# Patient Record
Sex: Female | Born: 1990 | Race: White | Hispanic: No | Marital: Single | Smoking: Never smoker
Health system: Southern US, Community
[De-identification: ages and names within clinical notes are randomized; demographics above are authoritative.]

## PROBLEM LIST (undated history)

## (undated) HISTORY — PX: WISDOM TOOTH EXTRACTION: SHX21

---

## 2010-12-28 ENCOUNTER — Observation Stay: Payer: Self-pay | Admitting: Obstetrics & Gynecology

## 2011-01-01 ENCOUNTER — Inpatient Hospital Stay: Payer: Self-pay

## 2013-01-04 IMAGING — US US OB FOLLOW-UP
1 series · 9 of 9 positions shown · non-contrast
Comparison: none

REASON FOR EXAM: AFI  h/o oligohydramnios
COMMENTS:

PROCEDURE:     US  - US OB FOLLOW UP  - December 29, 2010  [DATE]
RESULT:     Limited pelvic sonogram is performed to evaluate amniotic fluid.
Single intrauterine fetus is noted with a heart rate of 122 beats per minute
in a cephalic presentation. The amniotic fluid index is 5.3 cm.

[Series 1: us ob follow-up · 0.37mm/px · 9 of 9 slices shown]
[im 1/9]
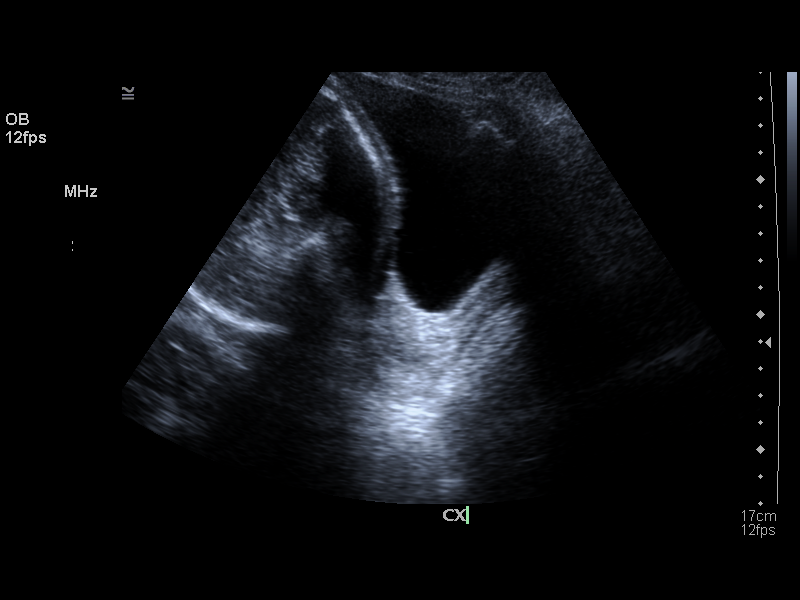
[im 2/9]
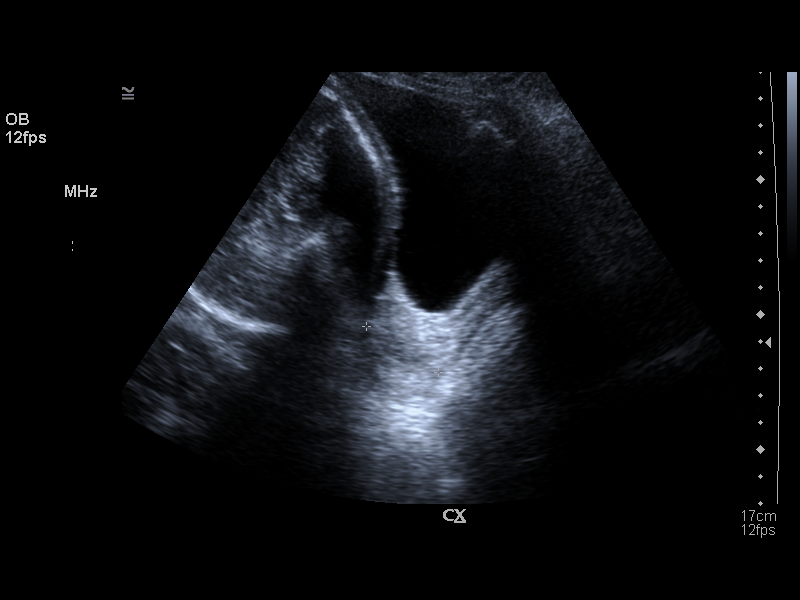
[im 3/9]
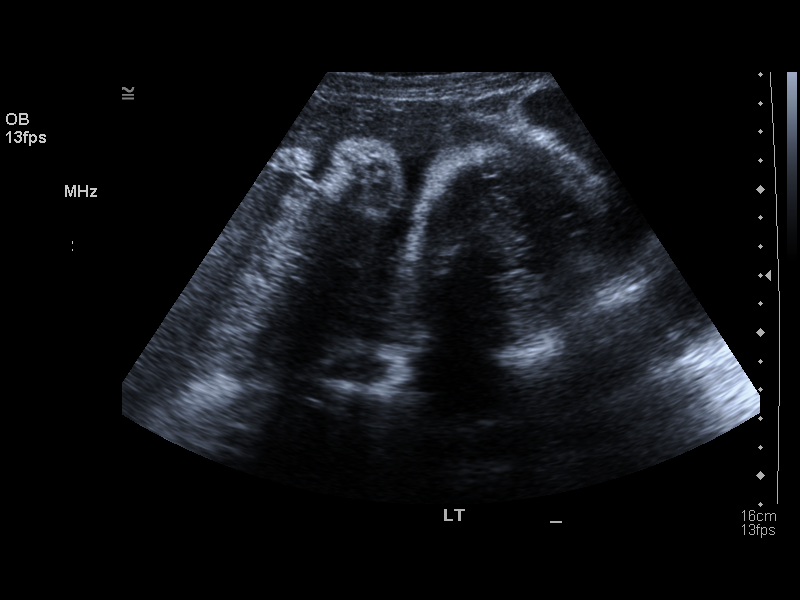
[im 4/9]
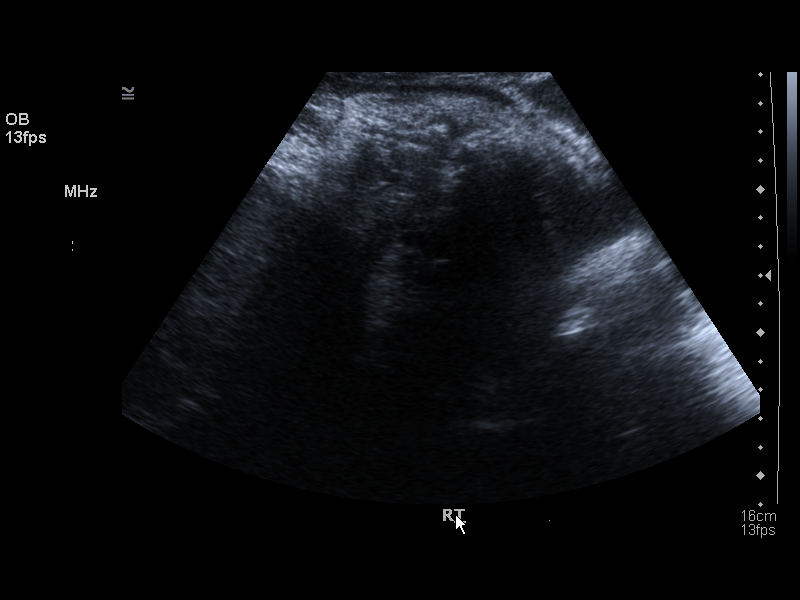
[im 5/9]
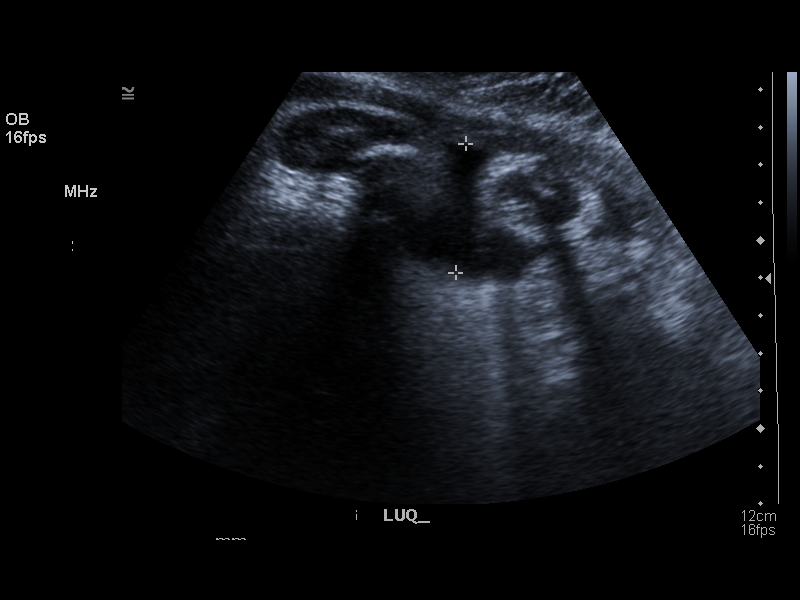
[im 6/9]
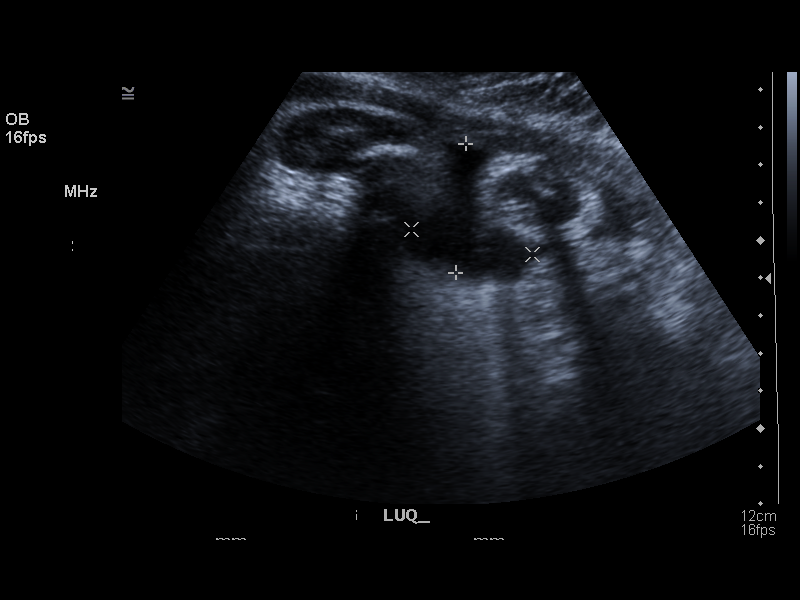
[im 7/9]
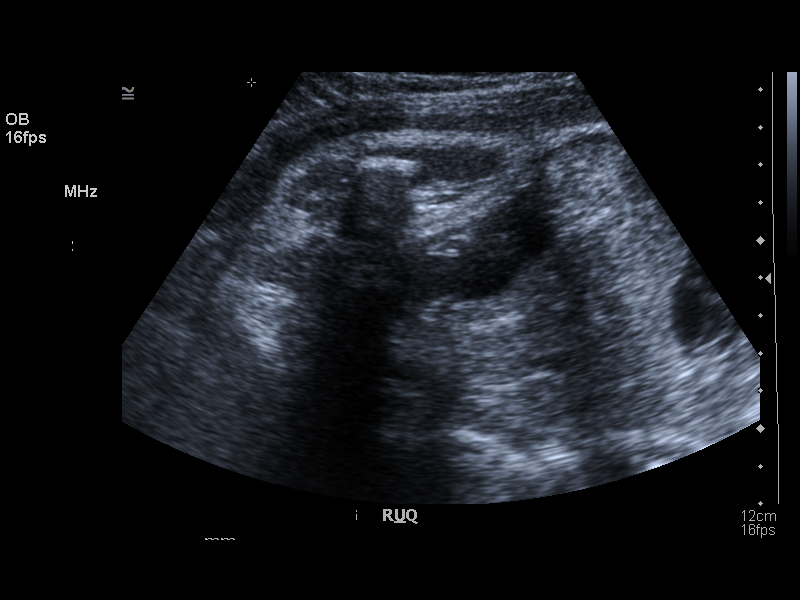
[im 8/9]
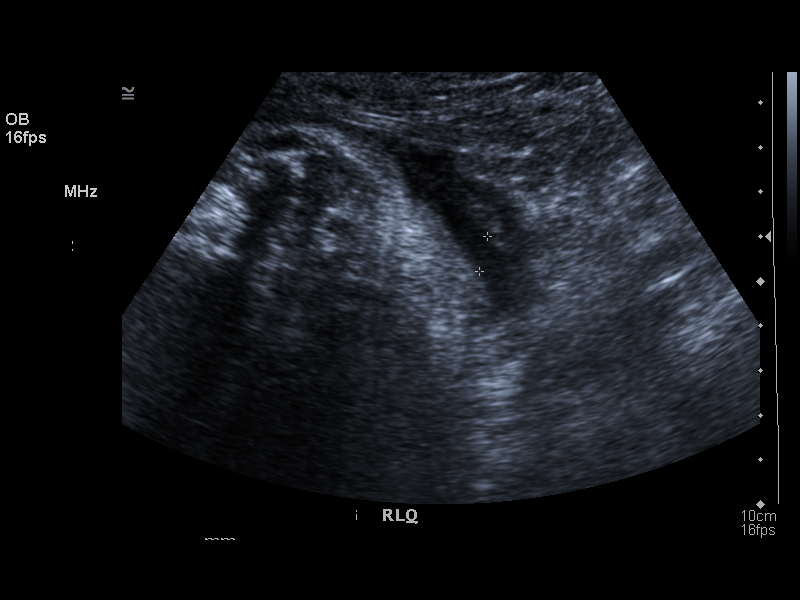
[im 9/9]
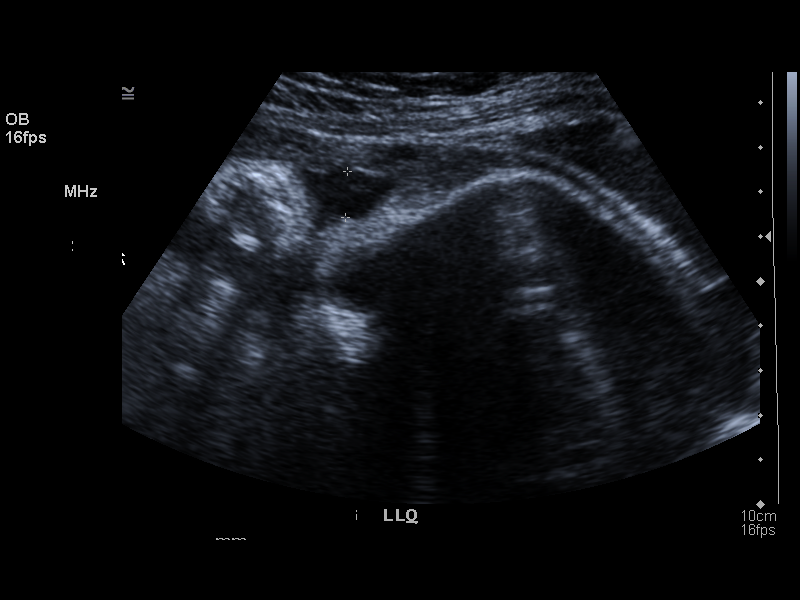

[9 of 9 positions shown; findings below may reference images not displayed]

IMPRESSION: 5.3 cm AFI. Given the lack of knowledge of the gestational
age of the intrauterine fetus the percentile of normal is uncertain.

## 2015-01-27 ENCOUNTER — Encounter: Payer: Self-pay | Admitting: Family Medicine

## 2015-01-27 ENCOUNTER — Ambulatory Visit (INDEPENDENT_AMBULATORY_CARE_PROVIDER_SITE_OTHER): Payer: Commercial Indemnity | Admitting: Family Medicine

## 2015-01-27 VITALS — BP 108/68 | HR 76 | Ht 62.0 in | Wt 144.0 lb

## 2015-01-27 DIAGNOSIS — Z1151 Encounter for screening for human papillomavirus (HPV): Secondary | ICD-10-CM | POA: Diagnosis not present

## 2015-01-27 DIAGNOSIS — Z124 Encounter for screening for malignant neoplasm of cervix: Secondary | ICD-10-CM

## 2015-01-27 DIAGNOSIS — Z113 Encounter for screening for infections with a predominantly sexual mode of transmission: Secondary | ICD-10-CM | POA: Diagnosis not present

## 2015-01-27 DIAGNOSIS — Z3490 Encounter for supervision of normal pregnancy, unspecified, unspecified trimester: Secondary | ICD-10-CM | POA: Insufficient documentation

## 2015-01-27 DIAGNOSIS — Z3491 Encounter for supervision of normal pregnancy, unspecified, first trimester: Secondary | ICD-10-CM

## 2015-01-27 DIAGNOSIS — Z3481 Encounter for supervision of other normal pregnancy, first trimester: Secondary | ICD-10-CM

## 2015-01-27 NOTE — Progress Notes (Signed)
   Subjective:    Marisa Daugherty is a G2P1001 10063w4d being seen today for her first obstetrical visit.  Her obstetrical history is not significant.  Pregnancy history fully reviewed.  Patient reports no complaints.  Filed Vitals:   01/27/15 1413 01/27/15 1414  BP: 108/68   Pulse: 76   Height:  5\' 2"  (1.575 m)  Weight: 144 lb (65.318 kg)     HISTORY: OB History  Gravida Para Term Preterm AB SAB TAB Ectopic Multiple Living  2 1 1       1     # Outcome Date GA Lbr Len/2nd Weight Sex Delivery Anes PTL Lv  2 Current           1 Term 01/01/11 5754w0d  4 lb 10 oz (2.098 kg) F Vag-Spont   Y     History reviewed. No pertinent past medical history. Past Surgical History  Procedure Laterality Date  . Wisdom tooth extraction     Family History  Problem Relation Age of Onset  . Parkinsonism Maternal Grandmother      Exam    Uterus:    8 wk size  Pelvic Exam:    Perineum: Normal Perineum   Vulva: Bartholin's, Urethra, Skene's normal   Vagina:  normal mucosa   Cervix: multiparous appearance, no cervical motion tenderness and no lesions   Adnexa: normal adnexa   Bony Pelvis: average  System: Breast:  normal appearance, no masses or tenderness   Skin: normal coloration and turgor, no rashes    Neurologic: oriented   Extremities: normal strength, tone, and muscle mass   HEENT extra ocular movement intact and sclera clear, anicteric   Mouth/Teeth mucous membranes moist, pharynx normal without lesions   Neck supple   Cardiovascular: regular rate and rhythm, no murmurs or gallops   Respiratory:  appears well, vitals normal, no respiratory distress, acyanotic, normal RR, ear and throat exam is normal, neck free of mass or lymphadenopathy, chest clear, no wheezing, crepitations, rhonchi, normal symmetric air entry   Abdomen: soft, non-tender; bowel sounds normal; no masses,  no organomegaly      Assessment:    Pregnancy: G2P1001 Patient Active Problem List   Diagnosis Date Noted   . Supervision of normal pregnancy 01/27/2015        Plan:     Initial labs drawn. Prenatal vitamins. Problem list reviewed and updated. Genetic Screening discussed First Screen: ordered.  Ultrasound discussed; fetal survey: discussed.  Follow up in 4 weeks.    PRATT,TANYA S 01/27/2015

## 2015-01-27 NOTE — Progress Notes (Signed)
Limited OB U/s reveals SIUP CRL 7 wk 4 day + flicker--LMP is accurate but cycle varies from 28-32 days

## 2015-01-27 NOTE — Patient Instructions (Signed)
First Trimester of Pregnancy The first trimester of pregnancy is from week 1 until the end of week 12 (months 1 through 3). A week after a sperm fertilizes an egg, the egg will implant on the wall of the uterus. This embryo will begin to develop into a baby. Genes from you and your partner are forming the baby. The female genes determine whether the baby is a boy or a girl. At 6-8 weeks, the eyes and face are formed, and the heartbeat can be seen on ultrasound. At the end of 12 weeks, all the baby's organs are formed.  Now that you are pregnant, you will want to do everything you can to have a healthy baby. Two of the most important things are to get good prenatal care and to follow your health care provider's instructions. Prenatal care is all the medical care you receive before the baby's birth. This care will help prevent, find, and treat any problems during the pregnancy and childbirth. BODY CHANGES Your body goes through many changes during pregnancy. The changes vary from woman to woman.   You may gain or lose a couple of pounds at first.  You may feel sick to your stomach (nauseous) and throw up (vomit). If the vomiting is uncontrollable, call your health care provider.  You may tire easily.  You may develop headaches that can be relieved by medicines approved by your health care provider.  You may urinate more often. Painful urination may mean you have a bladder infection.  You may develop heartburn as a result of your pregnancy.  You may develop constipation because certain hormones are causing the muscles that push waste through your intestines to slow down.  You may develop hemorrhoids or swollen, bulging veins (varicose veins).  Your breasts may begin to grow larger and become tender. Your nipples may stick out more, and the tissue that surrounds them (areola) may become darker.  Your gums may bleed and may be sensitive to brushing and flossing.  Dark spots or blotches  (chloasma, mask of pregnancy) may develop on your face. This will likely fade after the baby is born.  Your menstrual periods will stop.  You may have a loss of appetite.  You may develop cravings for certain kinds of food.  You may have changes in your emotions from day to day, such as being excited to be pregnant or being concerned that something may go wrong with the pregnancy and baby.  You may have more vivid and strange dreams.  You may have changes in your hair. These can include thickening of your hair, rapid growth, and changes in texture. Some women also have hair loss during or after pregnancy, or hair that feels dry or thin. Your hair will most likely return to normal after your baby is born. WHAT TO EXPECT AT YOUR PRENATAL VISITS During a routine prenatal visit:  You will be weighed to make sure you and the baby are growing normally.  Your blood pressure will be taken.  Your abdomen will be measured to track your baby's growth.  The fetal heartbeat will be listened to starting around week 10 or 12 of your pregnancy.  Test results from any previous visits will be discussed. Your health care provider may ask you:  How you are feeling.  If you are feeling the baby move.  If you have had any abnormal symptoms, such as leaking fluid, bleeding, severe headaches, or abdominal cramping.  If you have any questions. Other tests   that may be performed during your first trimester include:  Blood tests to find your blood type and to check for the presence of any previous infections. They will also be used to check for low iron levels (anemia) and Rh antibodies. Later in the pregnancy, blood tests for diabetes will be done along with other tests if problems develop.  Urine tests to check for infections, diabetes, or protein in the urine.  An ultrasound to confirm the proper growth and development of the baby.  An amniocentesis to check for possible genetic problems.  Fetal  screens for spina bifida and Down syndrome.  You may need other tests to make sure you and the baby are doing well. HOME CARE INSTRUCTIONS  Medicines  Follow your health care provider's instructions regarding medicine use. Specific medicines may be either safe or unsafe to take during pregnancy.  Take your prenatal vitamins as directed.  If you develop constipation, try taking a stool softener if your health care provider approves. Diet  Eat regular, well-balanced meals. Choose a variety of foods, such as meat or vegetable-based protein, fish, milk and low-fat dairy products, vegetables, fruits, and whole grain breads and cereals. Your health care provider will help you determine the amount of weight gain that is right for you.  Avoid raw meat and uncooked cheese. These carry germs that can cause birth defects in the baby.  Eating four or five small meals rather than three large meals a day may help relieve nausea and vomiting. If you start to feel nauseous, eating a few soda crackers can be helpful. Drinking liquids between meals instead of during meals also seems to help nausea and vomiting.  If you develop constipation, eat more high-fiber foods, such as fresh vegetables or fruit and whole grains. Drink enough fluids to keep your urine clear or pale yellow. Activity and Exercise  Exercise only as directed by your health care provider. Exercising will help you:  Control your weight.  Stay in shape.  Be prepared for labor and delivery.  Experiencing pain or cramping in the lower abdomen or low back is a good sign that you should stop exercising. Check with your health care provider before continuing normal exercises.  Try to avoid standing for long periods of time. Move your legs often if you must stand in one place for a long time.  Avoid heavy lifting.  Wear low-heeled shoes, and practice good posture.  You may continue to have sex unless your health care provider directs you  otherwise. Relief of Pain or Discomfort  Wear a good support bra for breast tenderness.   Take warm sitz baths to soothe any pain or discomfort caused by hemorrhoids. Use hemorrhoid cream if your health care provider approves.   Rest with your legs elevated if you have leg cramps or low back pain.  If you develop varicose veins in your legs, wear support hose. Elevate your feet for 15 minutes, 3-4 times a day. Limit salt in your diet. Prenatal Care  Schedule your prenatal visits by the twelfth week of pregnancy. They are usually scheduled monthly at first, then more often in the last 2 months before delivery.  Write down your questions. Take them to your prenatal visits.  Keep all your prenatal visits as directed by your health care provider. Safety  Wear your seat belt at all times when driving.  Make a list of emergency phone numbers, including numbers for family, friends, the hospital, and police and fire departments. General Tips    Ask your health care provider for a referral to a local prenatal education class. Begin classes no later than at the beginning of month 6 of your pregnancy.  Ask for help if you have counseling or nutritional needs during pregnancy. Your health care provider can offer advice or refer you to specialists for help with various needs.  Do not use hot tubs, steam rooms, or saunas.  Do not douche or use tampons or scented sanitary pads.  Do not cross your legs for long periods of time.  Avoid cat litter boxes and soil used by cats. These carry germs that can cause birth defects in the baby and possibly loss of the fetus by miscarriage or stillbirth.  Avoid all smoking, herbs, alcohol, and medicines not prescribed by your health care provider. Chemicals in these affect the formation and growth of the baby.  Schedule a dentist appointment. At home, brush your teeth with a soft toothbrush and be gentle when you floss. SEEK MEDICAL CARE IF:   You have  dizziness.  You have mild pelvic cramps, pelvic pressure, or nagging pain in the abdominal area.  You have persistent nausea, vomiting, or diarrhea.  You have a bad smelling vaginal discharge.  You have pain with urination.  You notice increased swelling in your face, hands, legs, or ankles. SEEK IMMEDIATE MEDICAL CARE IF:   You have a fever.  You are leaking fluid from your vagina.  You have spotting or bleeding from your vagina.  You have severe abdominal cramping or pain.  You have rapid weight gain or loss.  You vomit blood or material that looks like coffee grounds.  You are exposed to German measles and have never had them.  You are exposed to fifth disease or chickenpox.  You develop a severe headache.  You have shortness of breath.  You have any kind of trauma, such as from a fall or a car accident. Document Released: 07/12/2001 Document Revised: 12/02/2013 Document Reviewed: 05/28/2013 [employer] Patient Information 2015 [employer]. This information is not intended to replace advice given to you by your health care provider. Make sure you discuss any questions you have with your health care provider.  Breastfeeding Deciding to breastfeed is one of the best choices you can make for you and your baby. A change in hormones during pregnancy causes your breast tissue to grow and increases the number and size of your milk ducts. These hormones also allow proteins, sugars, and fats from your blood supply to make breast milk in your milk-producing glands. Hormones prevent breast milk from being released before your baby is born as well as prompt milk flow after birth. Once breastfeeding has begun, thoughts of your baby, as well as his or her sucking or crying, can stimulate the release of milk from your milk-producing glands.  BENEFITS OF BREASTFEEDING For Your Baby  Your first milk (colostrum) helps your baby's digestive system function better.   There are antibodies  in your milk that help your baby fight off infections.   Your baby has a lower incidence of asthma, allergies, and sudden infant death syndrome.   The nutrients in breast milk are better for your baby than infant formulas and are designed uniquely for your baby's needs.   Breast milk improves your baby's brain development.   Your baby is less likely to develop other conditions, such as childhood obesity, asthma, or type 2 diabetes mellitus.  For You   Breastfeeding helps to create a very special bond between   you and your baby.   Breastfeeding is convenient. Breast milk is always available at the correct temperature and costs nothing.   Breastfeeding helps to burn calories and helps you lose the weight gained during pregnancy.   Breastfeeding makes your uterus contract to its prepregnancy size faster and slows bleeding (lochia) after you give birth.   Breastfeeding helps to lower your risk of developing type 2 diabetes mellitus, osteoporosis, and breast or ovarian cancer later in life. SIGNS THAT YOUR BABY IS HUNGRY Early Signs of Hunger  Increased alertness or activity.  Stretching.  Movement of the head from side to side.  Movement of the head and opening of the mouth when the corner of the mouth or cheek is stroked (rooting).  Increased sucking sounds, smacking lips, cooing, sighing, or squeaking.  Hand-to-mouth movements.  Increased sucking of fingers or hands. Late Signs of Hunger  Fussing.  Intermittent crying. Extreme Signs of Hunger Signs of extreme hunger will require calming and consoling before your baby will be able to breastfeed successfully. Do not wait for the following signs of extreme hunger to occur before you initiate breastfeeding:   Restlessness.  A loud, strong cry.   Screaming. BREASTFEEDING BASICS Breastfeeding Initiation  Find a comfortable place to sit or lie down, with your neck and back well supported.  Place a pillow or  rolled up blanket under your baby to bring him or her to the level of your breast (if you are seated). Nursing pillows are specially designed to help support your arms and your baby while you breastfeed.  Make sure that your baby's abdomen is facing your abdomen.   Gently massage your breast. With your fingertips, massage from your chest wall toward your nipple in a circular motion. This encourages milk flow. You may need to continue this action during the feeding if your milk flows slowly.  Support your breast with 4 fingers underneath and your thumb above your nipple. Make sure your fingers are well away from your nipple and your baby's mouth.   Stroke your baby's lips gently with your finger or nipple.   When your baby's mouth is open wide enough, quickly bring your baby to your breast, placing your entire nipple and as much of the colored area around your nipple (areola) as possible into your baby's mouth.   More areola should be visible above your baby's upper lip than below the lower lip.   Your baby's tongue should be between his or her lower gum and your breast.   Ensure that your baby's mouth is correctly positioned around your nipple (latched). Your baby's lips should create a seal on your breast and be turned out (everted).  It is common for your baby to suck about 2-3 minutes in order to start the flow of breast milk. Latching Teaching your baby how to latch on to your breast properly is very important. An improper latch can cause nipple pain and decreased milk supply for you and poor weight gain in your baby. Also, if your baby is not latched onto your nipple properly, he or she may swallow some air during feeding. This can make your baby fussy. Burping your baby when you switch breasts during the feeding can help to get rid of the air. However, teaching your baby to latch on properly is still the best way to prevent fussiness from swallowing air while breastfeeding. Signs  that your baby has successfully latched on to your nipple:    Silent tugging or silent   sucking, without causing you pain.   Swallowing heard between every 3-4 sucks.    Muscle movement above and in front of his or her ears while sucking.  Signs that your baby has not successfully latched on to nipple:   Sucking sounds or smacking sounds from your baby while breastfeeding.  Nipple pain. If you think your baby has not latched on correctly, slip your finger into the corner of your baby's mouth to break the suction and place it between your baby's gums. Attempt breastfeeding initiation again. Signs of Successful Breastfeeding Signs from your baby:   A gradual decrease in the number of sucks or complete cessation of sucking.   Falling asleep.   Relaxation of his or her body.   Retention of a small amount of milk in his or her mouth.   Letting go of your breast by himself or herself. Signs from you:  Breasts that have increased in firmness, weight, and size 1-3 hours after feeding.   Breasts that are softer immediately after breastfeeding.  Increased milk volume, as well as a change in milk consistency and color by the fifth day of breastfeeding.   Nipples that are not sore, cracked, or bleeding. Signs That Your Baby is Getting Enough Milk  Wetting at least 3 diapers in a 24-hour period. The urine should be clear and pale yellow by age 5 days.  At least 3 stools in a 24-hour period by age 5 days. The stool should be soft and yellow.  At least 3 stools in a 24-hour period by age 7 days. The stool should be seedy and yellow.  No loss of weight greater than 10% of birth weight during the first 3 days of age.  Average weight gain of 4-7 ounces (113-198 g) per week after age 4 days.  Consistent daily weight gain by age 5 days, without weight loss after the age of 2 weeks. After a feeding, your baby may spit up a small amount. This is common. BREASTFEEDING FREQUENCY AND  DURATION Frequent feeding will help you make more milk and can prevent sore nipples and breast engorgement. Breastfeed when you feel the need to reduce the fullness of your breasts or when your baby shows signs of hunger. This is called "breastfeeding on demand." Avoid introducing a pacifier to your baby while you are working to establish breastfeeding (the first 4-6 weeks after your baby is born). After this time you may choose to use a pacifier. Research has shown that pacifier use during the first year of a baby's life decreases the risk of sudden infant death syndrome (SIDS). Allow your baby to feed on each breast as long as he or she wants. Breastfeed until your baby is finished feeding. When your baby unlatches or falls asleep while feeding from the first breast, offer the second breast. Because newborns are often sleepy in the first few weeks of life, you may need to awaken your baby to get him or her to feed. Breastfeeding times will vary from baby to baby. However, the following rules can serve as a guide to help you ensure that your baby is properly fed:  Newborns (babies 4 weeks of age or younger) may breastfeed every 1-3 hours.  Newborns should not go longer than 3 hours during the day or 5 hours during the night without breastfeeding.  You should breastfeed your baby a minimum of 8 times in a 24-hour period until you begin to introduce solid foods to your baby at around 6   months of age. BREAST MILK PUMPING Pumping and storing breast milk allows you to ensure that your baby is exclusively fed your breast milk, even at times when you are unable to breastfeed. This is especially important if you are going back to work while you are still breastfeeding or when you are not able to be present during feedings. Your lactation consultant can give you guidelines on how long it is safe to store breast milk.  A breast pump is a machine that allows you to pump milk from your breast into a sterile bottle.  The pumped breast milk can then be stored in a refrigerator or freezer. Some breast pumps are operated by hand, while others use electricity. Ask your lactation consultant which type will work best for you. Breast pumps can be purchased, but some hospitals and breastfeeding support groups lease breast pumps on a monthly basis. A lactation consultant can teach you how to hand express breast milk, if you prefer not to use a pump.  CARING FOR YOUR BREASTS WHILE YOU BREASTFEED Nipples can become dry, cracked, and sore while breastfeeding. The following recommendations can help keep your breasts moisturized and healthy:  Avoid using soap on your nipples.   Wear a supportive bra. Although not required, special nursing bras and tank tops are designed to allow access to your breasts for breastfeeding without taking off your entire bra or top. Avoid wearing underwire-style bras or extremely tight bras.  Air dry your nipples for 3-4minutes after each feeding.   Use only cotton bra pads to absorb leaked breast milk. Leaking of breast milk between feedings is normal.   Use lanolin on your nipples after breastfeeding. Lanolin helps to maintain your skin's normal moisture barrier. If you use pure lanolin, you do not need to wash it off before feeding your baby again. Pure lanolin is not toxic to your baby. You may also hand express a few drops of breast milk and gently massage that milk into your nipples and allow the milk to air dry. In the first few weeks after giving birth, some women experience extremely full breasts (engorgement). Engorgement can make your breasts feel heavy, warm, and tender to the touch. Engorgement peaks within 3-5 days after you give birth. The following recommendations can help ease engorgement:  Completely empty your breasts while breastfeeding or pumping. You may want to start by applying warm, moist heat (in the shower or with warm water-soaked hand towels) just before feeding or  pumping. This increases circulation and helps the milk flow. If your baby does not completely empty your breasts while breastfeeding, pump any extra milk after he or she is finished.  Wear a snug bra (nursing or regular) or tank top for 1-2 days to signal your body to slightly decrease milk production.  Apply ice packs to your breasts, unless this is too uncomfortable for you.  Make sure that your baby is latched on and positioned properly while breastfeeding. If engorgement persists after 48 hours of following these recommendations, contact your health care provider or a lactation consultant. OVERALL HEALTH CARE RECOMMENDATIONS WHILE BREASTFEEDING  Eat healthy foods. Alternate between meals and snacks, eating 3 of each per day. Because what you eat affects your breast milk, some of the foods may make your baby more irritable than usual. Avoid eating these foods if you are sure that they are negatively affecting your baby.  Drink milk, fruit juice, and water to satisfy your thirst (about 10 glasses a day).   Rest   often, relax, and continue to take your prenatal vitamins to prevent fatigue, stress, and anemia.  Continue breast self-awareness checks.  Avoid chewing and smoking tobacco.  Avoid alcohol and drug use. Some medicines that may be harmful to your baby can pass through breast milk. It is important to ask your health care provider before taking any medicine, including all over-the-counter and prescription medicine as well as vitamin and herbal supplements. It is possible to become pregnant while breastfeeding. If birth control is desired, ask your health care provider about options that will be safe for your baby. SEEK MEDICAL CARE IF:   You feel like you want to stop breastfeeding or have become frustrated with breastfeeding.  You have painful breasts or nipples.  Your nipples are cracked or bleeding.  Your breasts are red, tender, or warm.  You have a swollen area on either  breast.  You have a fever or chills.  You have nausea or vomiting.  You have drainage other than breast milk from your nipples.  Your breasts do not become full before feedings by the fifth day after you give birth.  You feel sad and depressed.  Your baby is too sleepy to eat well.  Your baby is having trouble sleeping.   Your baby is wetting less than 3 diapers in a 24-hour period.  Your baby has less than 3 stools in a 24-hour period.  Your baby's skin or the white part of his or her eyes becomes yellow.   Your baby is not gaining weight by 5 days of age. SEEK IMMEDIATE MEDICAL CARE IF:   Your baby is overly tired (lethargic) and does not want to wake up and feed.  Your baby develops an unexplained fever. Document Released: 07/18/2005 Document Revised: 07/23/2013 Document Reviewed: 01/09/2013 [employer] Patient Information 2015 [employer]. This information is not intended to replace advice given to you by your health care provider. Make sure you discuss any questions you have with your health care provider.  

## 2015-01-27 NOTE — Progress Notes (Signed)
Bedside ultrasound today measures [redacted]w[redacted]d fetus with heartbeat.  

## 2015-01-29 LAB — HEMOGLOBINOPATHY EVALUATION
Hemoglobin Other: 0 %
Hgb A2 Quant: 2.7 % (ref 2.2–3.2)
Hgb A: 97.3 % (ref 96.8–97.8)
Hgb F Quant: 0 % (ref 0.0–2.0)
Hgb S Quant: 0 %

## 2015-01-29 LAB — CULTURE, OB URINE

## 2015-01-29 LAB — PRENATAL PROFILE (SOLSTAS)
Antibody Screen: NEGATIVE
Basophils Absolute: 0 10*3/uL (ref 0.0–0.1)
Basophils Relative: 0 % (ref 0–1)
Eosinophils Absolute: 0.1 10*3/uL (ref 0.0–0.7)
Eosinophils Relative: 1 % (ref 0–5)
HEMATOCRIT: 36.8 % (ref 36.0–46.0)
HEP B S AG: NEGATIVE
HIV: NONREACTIVE
Hemoglobin: 12.7 g/dL (ref 12.0–15.0)
LYMPHS ABS: 2.3 10*3/uL (ref 0.7–4.0)
Lymphocytes Relative: 23 % (ref 12–46)
MCH: 30.2 pg (ref 26.0–34.0)
MCHC: 34.5 g/dL (ref 30.0–36.0)
MCV: 87.4 fL (ref 78.0–100.0)
MONOS PCT: 3 % (ref 3–12)
MPV: 10.5 fL (ref 8.6–12.4)
Monocytes Absolute: 0.3 10*3/uL (ref 0.1–1.0)
NEUTROS ABS: 7.3 10*3/uL (ref 1.7–7.7)
NEUTROS PCT: 73 % (ref 43–77)
Platelets: 279 10*3/uL (ref 150–400)
RBC: 4.21 MIL/uL (ref 3.87–5.11)
RDW: 13 % (ref 11.5–15.5)
RUBELLA: 1.22 {index} — AB (ref <0.90)
Rh Type: POSITIVE
WBC: 10 10*3/uL (ref 4.0–10.5)

## 2015-01-29 LAB — CYTOLOGY - PAP

## 2015-02-04 LAB — CYSTIC FIBROSIS DIAGNOSTIC STUDY

## 2015-02-24 ENCOUNTER — Ambulatory Visit (INDEPENDENT_AMBULATORY_CARE_PROVIDER_SITE_OTHER): Payer: Commercial Indemnity | Admitting: Obstetrics & Gynecology

## 2015-02-24 VITALS — BP 103/64 | HR 92 | Wt 142.0 lb

## 2015-02-24 DIAGNOSIS — Z3481 Encounter for supervision of other normal pregnancy, first trimester: Secondary | ICD-10-CM

## 2015-02-24 DIAGNOSIS — Z3491 Encounter for supervision of normal pregnancy, unspecified, first trimester: Secondary | ICD-10-CM

## 2015-02-24 NOTE — Assessment & Plan Note (Signed)
BABYSCRIPTS PATIENT: [x ] initial, [ x] 12, [ ] 20, [ ] 28, [ ] 32, [ ] 36, [ ] 38, [ ] 39, [ ] 40 

## 2015-02-24 NOTE — Progress Notes (Signed)
Subjective:  Marisa Daugherty is a 24 y.o. MW G2P1001 (24 yo girl)  at [redacted]w[redacted]d being seen today for ongoing prenatal care.  Patient reports no complaints.   .   .  . Denies leaking of fluid. She lost 2# by stopping all sodas.  The following portions of the patient's history were reviewed and updated as appropriate: allergies, current medications, past family history, past medical history, past social history, past surgical history and problem list.   Objective:  There were no vitals filed for this visit.  Fetal Status:           General:  Alert, oriented and cooperative. Patient is in no acute distress.  Skin: Skin is warm and dry. No rash noted.   Cardiovascular: Normal heart rate noted  Respiratory: Normal respiratory effort, no problems with respiration noted  Abdomen: Soft, gravid, appropriate for gestational age.       Vaginal:  .       Cervix: Not evaluated        Extremities: Normal range of motion.     Mental Status: Normal mood and affect. Normal behavior. Normal judgment and thought content.   Urinalysis:      Assessment and Plan:  Pregnancy: G2P1001 at [redacted]w[redacted]d  There are no diagnoses linked to this encounter. Preterm labor symptoms and general obstetric precautions including but not limited to vaginal bleeding, contractions, leaking of fluid and fetal movement were reviewed in detail with the patient. Please refer to After Visit Summary for other counseling recommendations.   She has her [facility] Screen at [facility] scheduled for next month.  She is joining [employer]. She will need MSAFP 16-20 weeks. We discussed recommended weight gain.  No Follow-up on file.   Allie Bossier, MD

## 2015-03-04 ENCOUNTER — Ambulatory Visit (HOSPITAL_COMMUNITY)
Admission: RE | Admit: 2015-03-04 | Discharge: 2015-03-04 | Disposition: A | Discharge status: Home / Self Care | Payer: Commercial Indemnity | Source: Ambulatory Visit | Attending: Family Medicine | Admitting: Family Medicine

## 2015-03-04 ENCOUNTER — Encounter (HOSPITAL_COMMUNITY): Payer: Self-pay

## 2015-03-04 DIAGNOSIS — Z3491 Encounter for supervision of normal pregnancy, unspecified, first trimester: Secondary | ICD-10-CM | POA: Insufficient documentation

## 2015-03-13 ENCOUNTER — Other Ambulatory Visit (HOSPITAL_COMMUNITY): Payer: Self-pay | Admitting: Family Medicine

## 2015-04-20 ENCOUNTER — Encounter: Payer: Commercial Indemnity | Admitting: Obstetrics & Gynecology

## 2015-04-22 ENCOUNTER — Other Ambulatory Visit: Payer: Self-pay | Admitting: Obstetrics & Gynecology

## 2015-04-22 ENCOUNTER — Ambulatory Visit (HOSPITAL_COMMUNITY)
Admission: RE | Admit: 2015-04-22 | Discharge: 2015-04-22 | Disposition: A | Discharge status: Home / Self Care | Payer: Commercial Indemnity | Source: Ambulatory Visit | Attending: Obstetrics & Gynecology | Admitting: Obstetrics & Gynecology

## 2015-04-22 DIAGNOSIS — Z3689 Encounter for other specified antenatal screening: Secondary | ICD-10-CM

## 2015-04-22 DIAGNOSIS — Z3A19 19 weeks gestation of pregnancy: Secondary | ICD-10-CM | POA: Insufficient documentation

## 2015-04-22 DIAGNOSIS — Z36 Encounter for antenatal screening of mother: Secondary | ICD-10-CM | POA: Insufficient documentation

## 2015-04-22 DIAGNOSIS — O283 Abnormal ultrasonic finding on antenatal screening of mother: Secondary | ICD-10-CM

## 2015-04-22 DIAGNOSIS — Z3491 Encounter for supervision of normal pregnancy, unspecified, first trimester: Secondary | ICD-10-CM

## 2015-04-23 ENCOUNTER — Ambulatory Visit (HOSPITAL_COMMUNITY): Payer: Commercial Indemnity

## 2015-04-28 ENCOUNTER — Ambulatory Visit (INDEPENDENT_AMBULATORY_CARE_PROVIDER_SITE_OTHER): Payer: Commercial Indemnity | Admitting: Obstetrics & Gynecology

## 2015-04-28 VITALS — BP 109/74 | HR 84 | Wt 148.0 lb

## 2015-04-28 DIAGNOSIS — Z3482 Encounter for supervision of other normal pregnancy, second trimester: Secondary | ICD-10-CM

## 2015-04-28 DIAGNOSIS — Z3492 Encounter for supervision of normal pregnancy, unspecified, second trimester: Secondary | ICD-10-CM

## 2015-04-28 DIAGNOSIS — Z23 Encounter for immunization: Secondary | ICD-10-CM | POA: Diagnosis not present

## 2015-04-28 NOTE — Progress Notes (Signed)
Subjective:  Marisa Daugherty is a 24 y.o. G2P1001 at [redacted]w[redacted]d being seen today for ongoing prenatal care.  Patient reports no complaints.  Contractions: Not present.   . Movement: Present. Denies leaking of fluid.   The following portions of the patient's history were reviewed and updated as appropriate: allergies, current medications, past family history, past medical history, past social history, past surgical history and problem list.   Objective:   Filed Vitals:   04/28/15 1321  BP: 109/74  Pulse: 84  Weight: 148 lb (67.132 kg)    Fetal Status:     Movement: Present     General:  Alert, oriented and cooperative. Patient is in no acute distress.  Skin: Skin is warm and dry. No rash noted.   Cardiovascular: Normal heart rate noted  Respiratory: Normal respiratory effort, no problems with respiration noted  Abdomen: Soft, gravid, appropriate for gestational age. Pain/Pressure: Absent     Pelvic:       Cervical exam deferred        Extremities: Normal range of motion.  Edema: None  Mental Status: Normal mood and affect. Normal behavior. Normal judgment and thought content.   Urinalysis: Urine Protein: Negative Urine Glucose: Negative  Assessment and Plan:  Pregnancy: G2P1001 at [redacted]w[redacted]d  1. Supervision of normal pregnancy, second trimester  - Flu Vaccine QUAD 36+ mos IM (Fluarix, Quad PF) - Alpha fetoprotein, maternal - She has genetic counseling 05/06/15 due to echogenic bowel seen on u/s  Preterm labor symptoms and general obstetric precautions including but not limited to vaginal bleeding, contractions, leaking of fluid and fetal movement were reviewed in detail with the patient. Please refer to After Visit Summary for other counseling recommendations.  Return in about 4 weeks (around 05/26/2015).   Allie Bossier, MD

## 2015-04-29 LAB — ALPHA FETOPROTEIN, MATERNAL
AFP: 56.6 ng/mL
Curr Gest Age: 20.5 wks.days
MOM FOR AFP: 0.88
OPEN SPINA BIFIDA: NEGATIVE
Osb Risk: 1:20500 {titer}

## 2015-05-06 ENCOUNTER — Ambulatory Visit (HOSPITAL_COMMUNITY): Payer: Commercial Indemnity | Attending: Obstetrics & Gynecology

## 2015-05-20 ENCOUNTER — Ambulatory Visit (HOSPITAL_COMMUNITY): Payer: Commercial Indemnity

## 2015-05-27 ENCOUNTER — Telehealth: Payer: Self-pay | Admitting: *Deleted

## 2015-05-27 NOTE — Telephone Encounter (Signed)
Error

## 2015-05-28 ENCOUNTER — Telehealth: Payer: Self-pay | Admitting: *Deleted

## 2015-05-28 NOTE — Telephone Encounter (Signed)
Called pt to follow-up with appointment, no answer, left message.

## 2015-06-23 ENCOUNTER — Ambulatory Visit (INDEPENDENT_AMBULATORY_CARE_PROVIDER_SITE_OTHER): Payer: Managed Care, Other (non HMO) | Admitting: Obstetrics & Gynecology

## 2015-06-23 VITALS — BP 123/79 | HR 96 | Wt 160.0 lb

## 2015-06-23 DIAGNOSIS — Z23 Encounter for immunization: Secondary | ICD-10-CM

## 2015-06-23 DIAGNOSIS — Z3493 Encounter for supervision of normal pregnancy, unspecified, third trimester: Secondary | ICD-10-CM

## 2015-06-23 DIAGNOSIS — Z36 Encounter for antenatal screening of mother: Secondary | ICD-10-CM

## 2015-06-23 DIAGNOSIS — Z3483 Encounter for supervision of other normal pregnancy, third trimester: Secondary | ICD-10-CM

## 2015-06-23 LAB — CBC
HEMATOCRIT: 30.3 % — AB (ref 36.0–46.0)
HEMOGLOBIN: 10.5 g/dL — AB (ref 12.0–15.0)
MCH: 30.4 pg (ref 26.0–34.0)
MCHC: 34.7 g/dL (ref 30.0–36.0)
MCV: 87.8 fL (ref 78.0–100.0)
MPV: 11.2 fL (ref 8.6–12.4)
Platelets: 230 10*3/uL (ref 150–400)
RBC: 3.45 MIL/uL — ABNORMAL LOW (ref 3.87–5.11)
RDW: 13.3 % (ref 11.5–15.5)
WBC: 9.3 10*3/uL (ref 4.0–10.5)

## 2015-06-23 NOTE — Progress Notes (Signed)
Subjective:  Marisa SabinaKristen M Daugherty is a 24 y.o. MW G2P1001 at 940w4d being seen today for ongoing prenatal care.  She is currently monitored for the following issues for this low-risk pregnancy: She decided not to get the genetic counseling (for a focus of echogenic bowel seen on u/s) Patient Active Problem List   Diagnosis Date Noted  . Supervision of normal pregnancy 01/27/2015   Patient reports no complaints.  Contractions: Not present. Vag. Bleeding: None.  Movement: Present. Denies leaking of fluid.   The following portions of the patient's history were reviewed and updated as appropriate: allergies, current medications, past family history, past medical history, past social history, past surgical history and problem list. Problem list updated.  Objective:   Filed Vitals:   06/23/15 0949  BP: 123/79  Pulse: 96  Weight: 160 lb (72.576 kg)    Fetal Status: Fetal Heart Rate (bpm): 154   Movement: Present     General:  Alert, oriented and cooperative. Patient is in no acute distress.  Skin: Skin is warm and dry. No rash noted.   Cardiovascular: Normal heart rate noted  Respiratory: Normal respiratory effort, no problems with respiration noted  Abdomen: Soft, gravid, appropriate for gestational age. Pain/Pressure: Absent     Pelvic: Vag. Bleeding: None Vag D/C Character: Thin   Cervical exam deferred        Extremities: Normal range of motion.  Edema: None  Mental Status: Normal mood and affect. Normal behavior. Normal judgment and thought content.   Urinalysis: Urine Protein: Negative Urine Glucose: Negative  Assessment and Plan:  Pregnancy: G2P1001 at 3140w4d  1. Supervision of normal pregnancy, third trimester  - Glucose Tolerance, 1 HR (50g) - CBC - HIV antibody - RPR - Tdap vaccine greater than or equal to 7yo IM  Preterm labor symptoms and general obstetric precautions including but not limited to vaginal bleeding, contractions, leaking of fluid and fetal movement were  reviewed in detail with the patient. Please refer to After Visit Summary for other counseling recommendations.  No Follow-up on file.   Allie BossierMyra C Donnielle Addison, MD

## 2015-06-24 LAB — SYPHILIS: RPR W/REFLEX TO RPR TITER AND TREPONEMAL ANTIBODIES, TRADITIONAL SCREENING AND DIAGNOSIS ALGORITHM

## 2015-06-24 LAB — HIV ANTIBODY (ROUTINE TESTING W REFLEX): HIV 1&2 Ab, 4th Generation: NONREACTIVE

## 2015-06-24 LAB — GLUCOSE TOLERANCE, 1 HOUR (50G) W/O FASTING: Glucose, 1 Hour GTT: 101 mg/dL (ref 70–140)

## 2015-07-21 ENCOUNTER — Ambulatory Visit (INDEPENDENT_AMBULATORY_CARE_PROVIDER_SITE_OTHER): Payer: Managed Care, Other (non HMO) | Admitting: Obstetrics & Gynecology

## 2015-07-21 VITALS — BP 107/72 | HR 105 | Wt 164.0 lb

## 2015-07-21 DIAGNOSIS — Z3493 Encounter for supervision of normal pregnancy, unspecified, third trimester: Secondary | ICD-10-CM

## 2015-07-21 DIAGNOSIS — Z3483 Encounter for supervision of other normal pregnancy, third trimester: Secondary | ICD-10-CM

## 2015-07-21 NOTE — Progress Notes (Signed)
Subjective:  Marisa SabinaKristen M Daugherty is a 24 y.o. MW G2P1001 at 5937w4d being seen today for ongoing prenatal care.  She is currently monitored for the following issues for this low-risk pregnancy and has Supervision of normal pregnancy on her problem list.  Patient reports no complaints.  Contractions: Not present. Vag. Bleeding: None.  Movement: Present. Denies leaking of fluid.   The following portions of the patient's history were reviewed and updated as appropriate: allergies, current medications, past family history, past medical history, past social history, past surgical history and problem list. Problem list updated.  Objective:   Filed Vitals:   07/21/15 1007  BP: 107/72  Pulse: 105  Weight: 164 lb (74.39 kg)    Fetal Status: Fetal Heart Rate (bpm): 144 Fundal Height: 32 cm Movement: Present     General:  Alert, oriented and cooperative. Patient is in no acute distress.  Skin: Skin is warm and dry. No rash noted.   Cardiovascular: Normal heart rate noted  Respiratory: Normal respiratory effort, no problems with respiration noted  Abdomen: Soft, gravid, appropriate for gestational age. Pain/Pressure: Present     Pelvic: Vag. Bleeding: None Vag D/C Character: Thin   Cervical exam deferred        Extremities: Normal range of motion.  Edema: None  Mental Status: Normal mood and affect. Normal behavior. Normal judgment and thought content.   Urinalysis: Urine Protein: Negative Urine Glucose: Negative  Assessment and Plan:  Pregnancy: G2P1001 at 7437w4d  1. Supervision of normal pregnancy, third trimester   Preterm labor symptoms and general obstetric precautions including but not limited to vaginal bleeding, contractions, leaking of fluid and fetal movement were reviewed in detail with the patient. Please refer to After Visit Summary for other counseling recommendations.  Return in about 3 weeks (around 08/11/2015).   Allie BossierMyra C Marvyn Torrez, MD

## 2015-08-02 NOTE — L&D Delivery Note (Signed)
Delivery Note After about an hour 2nd stage, At 0436 a viable female was delivered via  (Presentation:ROA ). Nuchal cord X1, loose, delivered through.  APGAR: 8/9 ; weight  Pending.  After 3 minutes, the cord was clamped and cut. 40 units of pitocin diluted in 1000cc LR was infused rapidly IV.  The placenta separated spontaneously and delivered via CCT and maternal pushing effort.  It was inspected and appears to be intact with a 3 VC.     Anesthesia: Epidural  Episiotomy:  none Lacerations:  none Suture Repair: n/a Est. Blood Loss (mL):  100  Mom to postpartum.  Baby to Couplet care / Skin to Skin.   Delivery by Donovan Kail, PA-S under my direct supervision.   CRESENZO-DISHMAN,Shantai Tiedeman 09/11/2015, 4:37 AM

## 2015-08-11 ENCOUNTER — Ambulatory Visit (INDEPENDENT_AMBULATORY_CARE_PROVIDER_SITE_OTHER): Payer: Managed Care, Other (non HMO) | Admitting: Family Medicine

## 2015-08-11 VITALS — BP 111/71 | HR 90 | Wt 168.0 lb

## 2015-08-11 DIAGNOSIS — Z3493 Encounter for supervision of normal pregnancy, unspecified, third trimester: Secondary | ICD-10-CM

## 2015-08-11 DIAGNOSIS — Z113 Encounter for screening for infections with a predominantly sexual mode of transmission: Secondary | ICD-10-CM

## 2015-08-11 DIAGNOSIS — Z36 Encounter for antenatal screening of mother: Secondary | ICD-10-CM

## 2015-08-11 DIAGNOSIS — Z3483 Encounter for supervision of other normal pregnancy, third trimester: Secondary | ICD-10-CM

## 2015-08-11 LAB — OB RESULTS CONSOLE GBS: STREP GROUP B AG: POSITIVE

## 2015-08-11 NOTE — Patient Instructions (Signed)
Breastfeeding Deciding to breastfeed is one of the best choices you can make for you and your baby. A change in hormones during pregnancy causes your breast tissue to grow and increases the number and size of your milk ducts. These hormones also allow proteins, sugars, and fats from your blood supply to make breast milk in your milk-producing glands. Hormones prevent breast milk from being released before your baby is born as well as prompt milk flow after birth. Once breastfeeding has begun, thoughts of your baby, as well as his or her sucking or crying, can stimulate the release of milk from your milk-producing glands.  BENEFITS OF BREASTFEEDING For Your Baby  Your first milk (colostrum) helps your baby's digestive system function better.  There are antibodies in your milk that help your baby fight off infections.  Your baby has a lower incidence of asthma, allergies, and sudden infant death syndrome.  The nutrients in breast milk are better for your baby than infant formulas and are designed uniquely for your baby's needs.  Breast milk improves your baby's brain development.  Your baby is less likely to develop other conditions, such as childhood obesity, asthma, or type 2 diabetes mellitus. For You  Breastfeeding helps to create a very special bond between you and your baby.  Breastfeeding is convenient. Breast milk is always available at the correct temperature and costs nothing.  Breastfeeding helps to burn calories and helps you lose the weight gained during pregnancy.  Breastfeeding makes your uterus contract to its prepregnancy size faster and slows bleeding (lochia) after you give birth.   Breastfeeding helps to lower your risk of developing type 2 diabetes mellitus, osteoporosis, and breast or ovarian cancer later in life. SIGNS THAT YOUR BABY IS HUNGRY Early Signs of Hunger  Increased alertness or activity.  Stretching.  Movement of the head from side to  side.  Movement of the head and opening of the mouth when the corner of the mouth or cheek is stroked (rooting).  Increased sucking sounds, smacking lips, cooing, sighing, or squeaking.  Hand-to-mouth movements.  Increased sucking of fingers or hands. Late Signs of Hunger  Fussing.  Intermittent crying. Extreme Signs of Hunger Signs of extreme hunger will require calming and consoling before your baby will be able to breastfeed successfully. Do not wait for the following signs of extreme hunger to occur before you initiate breastfeeding:  Restlessness.  A loud, strong cry.  Screaming. BREASTFEEDING BASICS Breastfeeding Initiation  Find a comfortable place to sit or lie down, with your neck and back well supported.  Place a pillow or rolled up blanket under your baby to bring him or her to the level of your breast (if you are seated). Nursing pillows are specially designed to help support your arms and your baby while you breastfeed.  Make sure that your baby's abdomen is facing your abdomen.  Gently massage your breast. With your fingertips, massage from your chest wall toward your nipple in a circular motion. This encourages milk flow. You may need to continue this action during the feeding if your milk flows slowly.  Support your breast with 4 fingers underneath and your thumb above your nipple. Make sure your fingers are well away from your nipple and your baby's mouth.  Stroke your baby's lips gently with your finger or nipple.  When your baby's mouth is open wide enough, quickly bring your baby to your breast, placing your entire nipple and as much of the colored area around your nipple (  areola) as possible into your baby's mouth.  More areola should be visible above your baby's upper lip than below the lower lip.  Your baby's tongue should be between his or her lower gum and your breast.  Ensure that your baby's mouth is correctly positioned around your nipple  (latched). Your baby's lips should create a seal on your breast and be turned out (everted).  It is common for your baby to suck about 2-3 minutes in order to start the flow of breast milk. Latching Teaching your baby how to latch on to your breast properly is very important. An improper latch can cause nipple pain and decreased milk supply for you and poor weight gain in your baby. Also, if your baby is not latched onto your nipple properly, he or she may swallow some air during feeding. This can make your baby fussy. Burping your baby when you switch breasts during the feeding can help to get rid of the air. However, teaching your baby to latch on properly is still the best way to prevent fussiness from swallowing air while breastfeeding. Signs that your baby has successfully latched on to your nipple:  Silent tugging or silent sucking, without causing you pain.  Swallowing heard between every 3-4 sucks.  Muscle movement above and in front of his or her ears while sucking. Signs that your baby has not successfully latched on to nipple:  Sucking sounds or smacking sounds from your baby while breastfeeding.  Nipple pain. If you think your baby has not latched on correctly, slip your finger into the corner of your baby's mouth to break the suction and place it between your baby's gums. Attempt breastfeeding initiation again. Signs of Successful Breastfeeding Signs from your baby:  A gradual decrease in the number of sucks or complete cessation of sucking.  Falling asleep.  Relaxation of his or her body.  Retention of a small amount of milk in his or her mouth.  Letting go of your breast by himself or herself. Signs from you:  Breasts that have increased in firmness, weight, and size 1-3 hours after feeding.  Breasts that are softer immediately after breastfeeding.  Increased milk volume, as well as a change in milk consistency and color by the fifth day of breastfeeding.  Nipples  that are not sore, cracked, or bleeding. Signs That Your Baby is Getting Enough Milk  Wetting at least 3 diapers in a 24-hour period. The urine should be clear and pale yellow by age 5 days.  At least 3 stools in a 24-hour period by age 5 days. The stool should be soft and yellow.  At least 3 stools in a 24-hour period by age 7 days. The stool should be seedy and yellow.  No loss of weight greater than 10% of birth weight during the first 3 days of age.  Average weight gain of 4-7 ounces (113-198 g) per week after age 4 days.  Consistent daily weight gain by age 5 days, without weight loss after the age of 2 weeks. After a feeding, your baby may spit up a small amount. This is common. BREASTFEEDING FREQUENCY AND DURATION Frequent feeding will help you make more milk and can prevent sore nipples and breast engorgement. Breastfeed when you feel the need to reduce the fullness of your breasts or when your baby shows signs of hunger. This is called "breastfeeding on demand." Avoid introducing a pacifier to your baby while you are working to establish breastfeeding (the first 4-6 weeks   after your baby is born). After this time you may choose to use a pacifier. Research has shown that pacifier use during the first year of a baby's life decreases the risk of sudden infant death syndrome (SIDS). Allow your baby to feed on each breast as long as he or she wants. Breastfeed until your baby is finished feeding. When your baby unlatches or falls asleep while feeding from the first breast, offer the second breast. Because newborns are often sleepy in the first few weeks of life, you may need to awaken your baby to get him or her to feed. Breastfeeding times will vary from baby to baby. However, the following rules can serve as a guide to help you ensure that your baby is properly fed:  Newborns (babies 4 weeks of age or younger) may breastfeed every 1-3 hours.  Newborns should not go longer than 3 hours  during the day or 5 hours during the night without breastfeeding.  You should breastfeed your baby a minimum of 8 times in a 24-hour period until you begin to introduce solid foods to your baby at around 6 months of age. BREAST MILK PUMPING Pumping and storing breast milk allows you to ensure that your baby is exclusively fed your breast milk, even at times when you are unable to breastfeed. This is especially important if you are going back to work while you are still breastfeeding or when you are not able to be present during feedings. Your lactation consultant can give you guidelines on how long it is safe to store breast milk. A breast pump is a machine that allows you to pump milk from your breast into a sterile bottle. The pumped breast milk can then be stored in a refrigerator or freezer. Some breast pumps are operated by hand, while others use electricity. Ask your lactation consultant which type will work best for you. Breast pumps can be purchased, but some hospitals and breastfeeding support groups lease breast pumps on a monthly basis. A lactation consultant can teach you how to hand express breast milk, if you prefer not to use a pump. CARING FOR YOUR BREASTS WHILE YOU BREASTFEED Nipples can become dry, cracked, and sore while breastfeeding. The following recommendations can help keep your breasts moisturized and healthy:  Avoid using soap on your nipples.  Wear a supportive bra. Although not required, special nursing bras and tank tops are designed to allow access to your breasts for breastfeeding without taking off your entire bra or top. Avoid wearing underwire-style bras or extremely tight bras.  Air dry your nipples for 3-4minutes after each feeding.  Use only cotton bra pads to absorb leaked breast milk. Leaking of breast milk between feedings is normal.  Use lanolin on your nipples after breastfeeding. Lanolin helps to maintain your skin's normal moisture barrier. If you use  pure lanolin, you do not need to wash it off before feeding your baby again. Pure lanolin is not toxic to your baby. You may also hand express a few drops of breast milk and gently massage that milk into your nipples and allow the milk to air dry. In the first few weeks after giving birth, some women experience extremely full breasts (engorgement). Engorgement can make your breasts feel heavy, warm, and tender to the touch. Engorgement peaks within 3-5 days after you give birth. The following recommendations can help ease engorgement:  Completely empty your breasts while breastfeeding or pumping. You may want to start by applying warm, moist heat (in   the shower or with warm water-soaked hand towels) just before feeding or pumping. This increases circulation and helps the milk flow. If your baby does not completely empty your breasts while breastfeeding, pump any extra milk after he or she is finished.  Wear a snug bra (nursing or regular) or tank top for 1-2 days to signal your body to slightly decrease milk production.  Apply ice packs to your breasts, unless this is too uncomfortable for you.  Make sure that your baby is latched on and positioned properly while breastfeeding. If engorgement persists after 48 hours of following these recommendations, contact your health care provider or a lactation consultant. OVERALL HEALTH CARE RECOMMENDATIONS WHILE BREASTFEEDING  Eat healthy foods. Alternate between meals and snacks, eating 3 of each per day. Because what you eat affects your breast milk, some of the foods may make your baby more irritable than usual. Avoid eating these foods if you are sure that they are negatively affecting your baby.  Drink milk, fruit juice, and water to satisfy your thirst (about 10 glasses a day).  Rest often, relax, and continue to take your prenatal vitamins to prevent fatigue, stress, and anemia.  Continue breast self-awareness checks.  Avoid chewing and smoking  tobacco. Chemicals from cigarettes that pass into breast milk and exposure to secondhand smoke may harm your baby.  Avoid alcohol and drug use, including marijuana. Some medicines that may be harmful to your baby can pass through breast milk. It is important to ask your health care provider before taking any medicine, including all over-the-counter and prescription medicine as well as vitamin and herbal supplements. It is possible to become pregnant while breastfeeding. If birth control is desired, ask your health care provider about options that will be safe for your baby. SEEK MEDICAL CARE IF:  You feel like you want to stop breastfeeding or have become frustrated with breastfeeding.  You have painful breasts or nipples.  Your nipples are cracked or bleeding.  Your breasts are red, tender, or warm.  You have a swollen area on either breast.  You have a fever or chills.  You have nausea or vomiting.  You have drainage other than breast milk from your nipples.  Your breasts do not become full before feedings by the fifth day after you give birth.  You feel sad and depressed.  Your baby is too sleepy to eat well.  Your baby is having trouble sleeping.   Your baby is wetting less than 3 diapers in a 24-hour period.  Your baby has less than 3 stools in a 24-hour period.  Your baby's skin or the white part of his or her eyes becomes yellow.   Your baby is not gaining weight by 5 days of age. SEEK IMMEDIATE MEDICAL CARE IF:  Your baby is overly tired (lethargic) and does not want to wake up and feed.  Your baby develops an unexplained fever.   This information is not intended to replace advice given to you by your health care provider. Make sure you discuss any questions you have with your health care provider.   Document Released: 07/18/2005 Document Revised: 04/08/2015 Document Reviewed: 01/09/2013 [employer] Interactive Patient Education 2016 [employer]  

## 2015-08-11 NOTE — Progress Notes (Signed)
Subjective:  Marisa Daugherty is a 25 y.o. G2P1001 at 3025w4d being seen today for ongoing prenatal care.  She is currently monitored for the following issues for this low-risk pregnancy and has Supervision of normal pregnancy on her problem list.  Patient reports no complaints.  Contractions: Not present. Vag. Bleeding: None.  Movement: Present. Denies leaking of fluid.   The following portions of the patient's history were reviewed and updated as appropriate: allergies, current medications, past family history, past medical history, past social history, past surgical history and problem list. Problem list updated.  Objective:   Filed Vitals:   08/11/15 1617  BP: 111/71  Pulse: 90  Weight: 168 lb (76.204 kg)    Fetal Status: Fetal Heart Rate (bpm): 128 Fundal Height: 35 cm Movement: Present  Presentation: Vertex  General:  Alert, oriented and cooperative. Patient is in no acute distress.  Skin: Skin is warm and dry. No rash noted.   Cardiovascular: Normal heart rate noted  Respiratory: Normal respiratory effort, no problems with respiration noted  Abdomen: Soft, gravid, appropriate for gestational age. Pain/Pressure: Absent     Pelvic: Vag. Bleeding: None Vag D/C Character: Thin   Cervical exam performed Dilation: 1 Effacement (%): Thick Station: Ballotable  Extremities: Normal range of motion.  Edema: None  Mental Status: Normal mood and affect. Normal behavior. Normal judgment and thought content.    Assessment and Plan:  Pregnancy: G2P1001 at 3425w4d  1. Supervision of normal pregnancy, third trimester Continue routine prenatal care.  - Urine cytology ancillary only - Culture, beta strep (group b only)  Preterm labor symptoms and general obstetric precautions including but not limited to vaginal bleeding, contractions, leaking of fluid and fetal movement were reviewed in detail with the patient. Please refer to After Visit Summary for other counseling recommendations.  Return  in 2 weeks (on 08/25/2015).   Reva Boresanya S Pratt, MD

## 2015-08-13 LAB — URINE CYTOLOGY ANCILLARY ONLY
Chlamydia: NEGATIVE
Neisseria Gonorrhea: NEGATIVE

## 2015-08-14 ENCOUNTER — Encounter: Payer: Self-pay | Admitting: Family Medicine

## 2015-08-14 DIAGNOSIS — O99820 Streptococcus B carrier state complicating pregnancy: Secondary | ICD-10-CM | POA: Insufficient documentation

## 2015-08-14 LAB — CULTURE, BETA STREP (GROUP B ONLY)

## 2015-08-26 ENCOUNTER — Ambulatory Visit (INDEPENDENT_AMBULATORY_CARE_PROVIDER_SITE_OTHER): Payer: Managed Care, Other (non HMO) | Admitting: Certified Nurse Midwife

## 2015-08-26 VITALS — BP 125/75 | HR 103 | Wt 164.0 lb

## 2015-08-26 DIAGNOSIS — Z3493 Encounter for supervision of normal pregnancy, unspecified, third trimester: Secondary | ICD-10-CM

## 2015-08-26 DIAGNOSIS — Z22330 Carrier of Group B streptococcus: Secondary | ICD-10-CM

## 2015-08-26 DIAGNOSIS — Z3483 Encounter for supervision of other normal pregnancy, third trimester: Secondary | ICD-10-CM

## 2015-08-26 DIAGNOSIS — O99820 Streptococcus B carrier state complicating pregnancy: Secondary | ICD-10-CM

## 2015-08-26 NOTE — Progress Notes (Signed)
Subjective:  Marisa Daugherty is a 25 y.o. G2P1001 at [redacted]w[redacted]d being seen today for ongoing prenatal care.  She is currently monitored for the following issues for this low-risk pregnancy and has Supervision of normal pregnancy and Group B Streptococcus carrier, +RV culture, currently pregnant on her problem list.  Patient reports rash on abdomen where her stretch marks are  Contractions: Not present. Vag. Bleeding: None.  Movement: Present. Denies leaking of fluid.   The following portions of the patient's history were reviewed and updated as appropriate: allergies, current medications, past family history, past medical history, past social history, past surgical history and problem list. Problem list updated.  Objective:   Filed Vitals:   08/26/15 1551  BP: 125/75  Pulse: 103  Weight: 164 lb (74.39 kg)    Fetal Status: Fetal Heart Rate (bpm): 140   Movement: Present     General:  Alert, oriented and cooperative. Patient is in no acute distress.  Skin: Skin is warm and dry. No rash noted.   Cardiovascular: Normal heart rate noted  Respiratory: Normal respiratory effort, no problems with respiration noted  Abdomen: Soft, gravid, appropriate for gestational age. Pain/Pressure: Absent     Pelvic: Vag. Bleeding: None Vag D/C Character: Thin   Cervical exam performed      1 th/bal  Extremities: Normal range of motion.  Edema: None  Mental Status: Normal mood and affect. Normal behavior. Normal judgment and thought content.   Urinalysis: Urine Protein: Negative Urine Glucose: Negative  Assessment and Plan:  Pregnancy: G2P1001 at [redacted]w[redacted]d  1. Supervision of normal pregnancy, third trimester   2. Group B Streptococcus carrier, +RV culture, currently pregnant  Term labor symptoms and general obstetric precautions including but not limited to vaginal bleeding, contractions, leaking of fluid and fetal movement were reviewed in detail with the patient. Please refer to After Visit Summary for  other counseling recommendations.  No Follow-up on file. F/U 1 week  Rhea Pink, CNM

## 2015-08-26 NOTE — Patient Instructions (Signed)

## 2015-08-27 ENCOUNTER — Other Ambulatory Visit (INDEPENDENT_AMBULATORY_CARE_PROVIDER_SITE_OTHER): Payer: Managed Care, Other (non HMO) | Admitting: *Deleted

## 2015-08-27 ENCOUNTER — Encounter: Payer: Managed Care, Other (non HMO) | Admitting: Obstetrics & Gynecology

## 2015-08-27 DIAGNOSIS — L299 Pruritus, unspecified: Secondary | ICD-10-CM | POA: Diagnosis not present

## 2015-08-27 LAB — COMPREHENSIVE METABOLIC PANEL
ALBUMIN: 3.3 g/dL — AB (ref 3.6–5.1)
ALK PHOS: 112 U/L (ref 33–115)
ALT: 8 U/L (ref 6–29)
AST: 15 U/L (ref 10–30)
BUN: 8 mg/dL (ref 7–25)
CALCIUM: 8.4 mg/dL — AB (ref 8.6–10.2)
CO2: 21 mmol/L (ref 20–31)
Chloride: 102 mmol/L (ref 98–110)
Creat: 0.57 mg/dL (ref 0.50–1.10)
Glucose, Bld: 87 mg/dL (ref 65–99)
POTASSIUM: 3.7 mmol/L (ref 3.5–5.3)
Sodium: 133 mmol/L — ABNORMAL LOW (ref 135–146)
TOTAL PROTEIN: 6 g/dL — AB (ref 6.1–8.1)
Total Bilirubin: 0.3 mg/dL (ref 0.2–1.2)

## 2015-08-27 NOTE — Progress Notes (Signed)
Patient having a lot of itching and came in today for a CMET and Bile acids.

## 2015-08-29 ENCOUNTER — Encounter: Payer: Self-pay | Admitting: Obstetrics & Gynecology

## 2015-08-29 DIAGNOSIS — O99719 Diseases of the skin and subcutaneous tissue complicating pregnancy, unspecified trimester: Secondary | ICD-10-CM

## 2015-08-29 DIAGNOSIS — L309 Dermatitis, unspecified: Secondary | ICD-10-CM | POA: Insufficient documentation

## 2015-08-29 LAB — BILE ACIDS, TOTAL: BILE ACIDS TOTAL: 9 umol/L (ref 0–19)

## 2015-09-02 ENCOUNTER — Ambulatory Visit (INDEPENDENT_AMBULATORY_CARE_PROVIDER_SITE_OTHER): Payer: Managed Care, Other (non HMO) | Admitting: Certified Nurse Midwife

## 2015-09-02 VITALS — BP 114/70 | HR 83 | Wt 165.0 lb

## 2015-09-02 DIAGNOSIS — L309 Dermatitis, unspecified: Secondary | ICD-10-CM

## 2015-09-02 DIAGNOSIS — Z3483 Encounter for supervision of other normal pregnancy, third trimester: Secondary | ICD-10-CM

## 2015-09-02 DIAGNOSIS — Z3493 Encounter for supervision of normal pregnancy, unspecified, third trimester: Secondary | ICD-10-CM

## 2015-09-02 DIAGNOSIS — O99713 Diseases of the skin and subcutaneous tissue complicating pregnancy, third trimester: Secondary | ICD-10-CM

## 2015-09-02 MED ORDER — HYDROXYZINE PAMOATE 25 MG PO CAPS: 25.0000 mg | ORAL_CAPSULE | Freq: Three times a day (TID) | ORAL | Status: AC | PRN

## 2015-09-02 NOTE — Patient Instructions (Signed)
Hydroxyzine capsules or tablets  What is this medicine?  HYDROXYZINE (hye DROX i zeen) is an antihistamine. This medicine is used to treat allergy symptoms. It is also used to treat anxiety and tension. This medicine can be used with other medicines to induce sleep before surgery.  This medicine may be used for other purposes; ask your health care provider or pharmacist if you have questions.  What should I tell my health care provider before I take this medicine?  They need to know if you have any of these conditions:  -any chronic illness  -difficulty passing urine  -glaucoma  -heart disease  -kidney disease  -liver disease  -lung disease  -an unusual or allergic reaction to hydroxyzine, cetirizine, other medicines, foods, dyes, or preservatives  -pregnant or trying to get pregnant  -breast-feeding  How should I use this medicine?  Take this medicine by mouth with a full glass of water. Follow the directions on the prescription label. You may take this medicine with food or on an empty stomach. Take your medicine at regular intervals. Do not take your medicine more often than directed.  Talk to your pediatrician regarding the use of this medicine in children. Special care may be needed. While this drug may be prescribed for children as young as 6 years of age for selected conditions, precautions do apply.  Patients over 65 years old may have a stronger reaction and need a smaller dose.  Overdosage: If you think you have taken too much of this medicine contact a poison control center or emergency room at once.  NOTE: This medicine is only for you. Do not share this medicine with others.  What if I miss a dose?  If you miss a dose, take it as soon as you can. If it is almost time for your next dose, take only that dose. Do not take double or extra doses.  What may interact with this medicine?  -alcohol  -barbiturate medicines for sleep or seizures  -medicines for colds, allergies  -medicines for depression, anxiety,  or emotional disturbances  -medicines for pain  -medicines for sleep  -muscle relaxants  This list may not describe all possible interactions. Give your health care provider a list of all the medicines, herbs, non-prescription drugs, or dietary supplements you use. Also tell them if you smoke, drink alcohol, or use illegal drugs. Some items may interact with your medicine.  What should I watch for while using this medicine?  Tell your doctor or health care professional if your symptoms do not improve.  You may get drowsy or dizzy. Do not drive, use machinery, or do anything that needs mental alertness until you know how this medicine affects you. Do not stand or sit up quickly, especially if you are an older patient. This reduces the risk of dizzy or fainting spells. Alcohol may interfere with the effect of this medicine. Avoid alcoholic drinks.  Your mouth may get dry. Chewing sugarless gum or sucking hard candy, and drinking plenty of water may help. Contact your doctor if the problem does not go away or is severe.  This medicine may cause dry eyes and blurred vision. If you wear contact lenses you may feel some discomfort. Lubricating drops may help. See your eye doctor if the problem does not go away or is severe.  If you are receiving skin tests for allergies, tell your doctor you are using this medicine.  What side effects may I notice from receiving this medicine?    Side effects that you should report to your doctor or health care professional as soon as possible:  -fast or irregular heartbeat  -difficulty passing urine  -seizures  -slurred speech or confusion  -tremor  Side effects that usually do not require medical attention (report to your doctor or health care professional if they continue or are bothersome):  -constipation  -drowsiness  -fatigue  -headache  -stomach upset  This list may not describe all possible side effects. Call your doctor for medical advice about side effects. You may report side  effects to FDA at 1-800-FDA-1088.  Where should I keep my medicine?  Keep out of the reach of children.  Store at room temperature between 15 and 30 degrees C (59 and 86 degrees F). Keep container tightly closed. Throw away any unused medicine after the expiration date.  NOTE: This sheet is a summary. It may not cover all possible information. If you have questions about this medicine, talk to your doctor, pharmacist, or health care provider.      2016, Elsevier/Gold Standard. (2007-11-30 14:50:59)

## 2015-09-02 NOTE — Progress Notes (Signed)
Pt had lab work last week to r/o cholestasis, bile acids WDL, pt still experiencing generalized itching.

## 2015-09-02 NOTE — Progress Notes (Signed)
Subjective:  Marisa Daugherty is a 25 y.o. G2P1001 at [redacted]w[redacted]d being seen today for ongoing prenatal care.  She is currently monitored for the following issues for this low-risk pregnancy and has Supervision of normal pregnancy; Group B Streptococcus carrier, +RV culture, currently pregnant; and Pruritic urticarial papules and plaques of pregnancy, antepartum on her problem list.  Patient reports severe itching.  Contractions: Not present. Vag. Bleeding: None.  Movement: Present. Denies leaking of fluid.   The following portions of the patient's history were reviewed and updated as appropriate: allergies, current medications, past family history, past medical history, past social history, past surgical history and problem list. Problem list updated.  Objective:   Filed Vitals:   09/02/15 1557  BP: 114/70  Pulse: 83  Weight: 165 lb (74.844 kg)    Fetal Status: Fetal Heart Rate (bpm): 142   Movement: Present     General:  Alert, oriented and cooperative. Patient is in no acute distress.  Skin: Skin is warm and dry. No rash noted.   Cardiovascular: Normal heart rate noted  Respiratory: Normal respiratory effort, no problems with respiration noted  Abdomen: Soft, gravid, appropriate for gestational age. Pain/Pressure: Absent     Pelvic: Vag. Bleeding: None Vag D/C Character: Thin   Cervical exam performed        Extremities: Normal range of motion.  Edema: None  Mental Status: Normal mood and affect. Normal behavior. Normal judgment and thought content.   Urinalysis: Urine Protein: Negative Urine Glucose: Negative  Assessment and Plan:  Pregnancy: G2P1001 at [redacted]w[redacted]d  1. Supervision of normal pregnancy, third trimester   2. Pruritic urticarial papules and plaques of pregnancy, antepartum, third trimester Vistaril 25 mg po  Term labor symptoms and general obstetric precautions including but not limited to vaginal bleeding, contractions, leaking of fluid and fetal movement were reviewed in  detail with the patient. Please refer to After Visit Summary for other counseling recommendations.  Return in about 1 week (around 09/09/2015).   Rhea Pink, CNM

## 2015-09-09 ENCOUNTER — Ambulatory Visit (INDEPENDENT_AMBULATORY_CARE_PROVIDER_SITE_OTHER): Payer: Managed Care, Other (non HMO) | Admitting: Certified Nurse Midwife

## 2015-09-09 VITALS — BP 134/77 | HR 82 | Wt 168.0 lb

## 2015-09-09 DIAGNOSIS — O2686 Pruritic urticarial papules and plaques of pregnancy (PUPPP): Secondary | ICD-10-CM

## 2015-09-09 DIAGNOSIS — O99713 Diseases of the skin and subcutaneous tissue complicating pregnancy, third trimester: Secondary | ICD-10-CM

## 2015-09-09 DIAGNOSIS — Z3483 Encounter for supervision of other normal pregnancy, third trimester: Secondary | ICD-10-CM

## 2015-09-09 DIAGNOSIS — Z3493 Encounter for supervision of normal pregnancy, unspecified, third trimester: Secondary | ICD-10-CM

## 2015-09-09 DIAGNOSIS — Z22330 Carrier of Group B streptococcus: Secondary | ICD-10-CM

## 2015-09-09 DIAGNOSIS — L309 Dermatitis, unspecified: Secondary | ICD-10-CM

## 2015-09-09 DIAGNOSIS — O99820 Streptococcus B carrier state complicating pregnancy: Secondary | ICD-10-CM

## 2015-09-09 NOTE — Progress Notes (Signed)
Subjective:  Marisa Daugherty is a 25 y.o. G2P1001 at [redacted]w[redacted]d being seen today for ongoing prenatal care.  She is currently monitored for the following issues for this low-risk pregnancy and has Supervision of normal pregnancy; Group B Streptococcus carrier, +RV culture, currently pregnant; and Pruritic urticarial papules and plaques of pregnancy, antepartum on her problem list.  Patient reports no complaints.  Contractions: Irregular. Vag. Bleeding: None.  Movement: Present. Denies leaking of fluid.   The following portions of the patient's history were reviewed and updated as appropriate: allergies, current medications, past family history, past medical history, past social history, past surgical history and problem list. Problem list updated.  Objective:   Filed Vitals:   09/09/15 1450  BP: 134/77  Pulse: 82  Weight: 168 lb (76.204 kg)    Fetal Status: Fetal Heart Rate (bpm): 136   Movement: Present     General:  Alert, oriented and cooperative. Patient is in no acute distress.  Skin: Skin is warm and dry. No rash noted.   Cardiovascular: Normal heart rate noted  Respiratory: Normal respiratory effort, no problems with respiration noted  Abdomen: Soft, gravid, appropriate for gestational age. Pain/Pressure: Absent     Pelvic: Vag. Bleeding: None Vag D/C Character: Thin   Cervical exam performed        Extremities: Normal range of motion.  Edema: Trace  Mental Status: Normal mood and affect. Normal behavior. Normal judgment and thought content.   Urinalysis: Urine Protein: Negative Urine Glucose: Negative  Assessment and Plan:  Pregnancy: G2P1001 at [redacted]w[redacted]d  1. Supervision of normal pregnancy, third trimester   2. Group B Streptococcus carrier, +RV culture, currently pregnant   3. Pruritic urticarial papules and plaques of pregnancy, antepartum, third trimester   Term labor symptoms and general obstetric precautions including but not limited to vaginal bleeding, contractions,  leaking of fluid and fetal movement were reviewed in detail with the patient. Please refer to After Visit Summary for other counseling recommendations.  Return in about 5 days (around 09/14/2015) for schedule nst.   Rhea Pink, CNM

## 2015-09-09 NOTE — Patient Instructions (Signed)

## 2015-09-10 ENCOUNTER — Inpatient Hospital Stay (HOSPITAL_COMMUNITY): Payer: 59 | Admitting: Anesthesiology

## 2015-09-10 ENCOUNTER — Encounter (HOSPITAL_COMMUNITY): Payer: Self-pay | Admitting: *Deleted

## 2015-09-10 ENCOUNTER — Inpatient Hospital Stay (HOSPITAL_COMMUNITY)
Admission: AD | Admit: 2015-09-10 | Discharge: 2015-09-12 | DRG: 775 | Disposition: A | Discharge status: Home / Self Care | Payer: 59 | Source: Ambulatory Visit | Attending: Obstetrics & Gynecology | Admitting: Obstetrics & Gynecology

## 2015-09-10 DIAGNOSIS — O26893 Other specified pregnancy related conditions, third trimester: Secondary | ICD-10-CM | POA: Diagnosis present

## 2015-09-10 DIAGNOSIS — Z820 Family history of epilepsy and other diseases of the nervous system: Secondary | ICD-10-CM

## 2015-09-10 DIAGNOSIS — Z3A39 39 weeks gestation of pregnancy: Secondary | ICD-10-CM

## 2015-09-10 DIAGNOSIS — O99824 Streptococcus B carrier state complicating childbirth: Secondary | ICD-10-CM | POA: Diagnosis present

## 2015-09-10 DIAGNOSIS — IMO0001 Reserved for inherently not codable concepts without codable children: Secondary | ICD-10-CM

## 2015-09-10 LAB — CBC
HCT: 34.5 % — ABNORMAL LOW (ref 36.0–46.0)
Hemoglobin: 11.6 g/dL — ABNORMAL LOW (ref 12.0–15.0)
MCH: 29.7 pg (ref 26.0–34.0)
MCHC: 33.6 g/dL (ref 30.0–36.0)
MCV: 88.5 fL (ref 78.0–100.0)
PLATELETS: 245 10*3/uL (ref 150–400)
RBC: 3.9 MIL/uL (ref 3.87–5.11)
RDW: 14.1 % (ref 11.5–15.5)
WBC: 10.5 10*3/uL (ref 4.0–10.5)

## 2015-09-10 LAB — TYPE AND SCREEN
ABO/RH(D): A POS
ANTIBODY SCREEN: NEGATIVE

## 2015-09-10 MED ORDER — PHENYLEPHRINE 40 MCG/ML (10ML) SYRINGE FOR IV PUSH (FOR BLOOD PRESSURE SUPPORT)
80.0000 ug | PREFILLED_SYRINGE | INTRAVENOUS | Status: DC | PRN
  Filled 2015-09-10: qty 20
  Filled 2015-09-10: qty 2

## 2015-09-10 MED ORDER — ONDANSETRON HCL 4 MG/2ML IJ SOLN: 4.0000 mg | Freq: Four times a day (QID) | INTRAMUSCULAR | Status: DC | PRN

## 2015-09-10 MED ORDER — LACTATED RINGERS IV SOLN: 500.0000 mL | INTRAVENOUS | Status: DC | PRN

## 2015-09-10 MED ORDER — LIDOCAINE HCL (PF) 1 % IJ SOLN
INTRAMUSCULAR | Status: DC | PRN
  Administered 2015-09-10 (×2): 8 mL via EPIDURAL

## 2015-09-10 MED ORDER — LIDOCAINE HCL (PF) 1 % IJ SOLN
30.0000 mL | INTRAMUSCULAR | Status: DC | PRN
  Filled 2015-09-10: qty 30

## 2015-09-10 MED ORDER — DIPHENHYDRAMINE HCL 50 MG/ML IJ SOLN: 12.5000 mg | INTRAMUSCULAR | Status: DC | PRN

## 2015-09-10 MED ORDER — LACTATED RINGERS IV SOLN
INTRAVENOUS | Status: DC
  Administered 2015-09-10 (×2): via INTRAVENOUS

## 2015-09-10 MED ORDER — DIPHENHYDRAMINE HCL 25 MG PO CAPS
25.0000 mg | ORAL_CAPSULE | Freq: Four times a day (QID) | ORAL | Status: DC | PRN
  Administered 2015-09-10: 25 mg via ORAL
  Filled 2015-09-10: qty 1

## 2015-09-10 MED ORDER — FENTANYL 2.5 MCG/ML BUPIVACAINE 1/10 % EPIDURAL INFUSION (WH - ANES)
14.0000 mL/h | INTRAMUSCULAR | Status: DC | PRN
  Administered 2015-09-10: 14 mL/h via EPIDURAL
  Filled 2015-09-10: qty 125

## 2015-09-10 MED ORDER — ACETAMINOPHEN 325 MG PO TABS: 650.0000 mg | ORAL_TABLET | ORAL | Status: DC | PRN

## 2015-09-10 MED ORDER — SODIUM CHLORIDE 0.9 % IV SOLN
2.0000 g | Freq: Four times a day (QID) | INTRAVENOUS | Status: DC
  Administered 2015-09-10 – 2015-09-11 (×2): 2 g via INTRAVENOUS
  Filled 2015-09-10 (×4): qty 2000

## 2015-09-10 MED ORDER — OXYCODONE-ACETAMINOPHEN 5-325 MG PO TABS: 2.0000 | ORAL_TABLET | ORAL | Status: DC | PRN

## 2015-09-10 MED ORDER — OXYTOCIN 10 UNIT/ML IJ SOLN
2.5000 [IU]/h | INTRAVENOUS | Status: DC
  Filled 2015-09-10: qty 4

## 2015-09-10 MED ORDER — EPHEDRINE 5 MG/ML INJ
10.0000 mg | INTRAVENOUS | Status: DC | PRN
  Filled 2015-09-10: qty 2

## 2015-09-10 MED ORDER — OXYTOCIN BOLUS FROM INFUSION: 500.0000 mL | INTRAVENOUS | Status: DC

## 2015-09-10 MED ORDER — FLEET ENEMA 7-19 GM/118ML RE ENEM: 1.0000 | ENEMA | RECTAL | Status: DC | PRN

## 2015-09-10 MED ORDER — CITRIC ACID-SODIUM CITRATE 334-500 MG/5ML PO SOLN: 30.0000 mL | ORAL | Status: DC | PRN

## 2015-09-10 MED ORDER — OXYCODONE-ACETAMINOPHEN 5-325 MG PO TABS: 1.0000 | ORAL_TABLET | ORAL | Status: DC | PRN

## 2015-09-10 NOTE — Anesthesia Procedure Notes (Signed)
Epidural Patient location during procedure: OB Start time: 09/10/2015 8:29 PM End time: 09/10/2015 8:33 PM  Staffing Anesthesiologist: Leilani Able Performed by: anesthesiologist   Preanesthetic Checklist Completed: patient identified, surgical consent, pre-op evaluation, timeout performed, IV checked, risks and benefits discussed and monitors and equipment checked  Epidural Patient position: sitting Prep: site prepped and draped and DuraPrep Patient monitoring: continuous pulse ox and blood pressure Approach: midline Location: L3-L4 Injection technique: LOR air  Needle:  Needle type: Tuohy  Needle gauge: 17 G Needle length: 9 cm and 9 Needle insertion depth: 5 cm cm Catheter type: closed end flexible Catheter size: 19 Gauge Catheter at skin depth: 10 cm Test dose: negative and Other  Assessment Sensory level: T9 Events: blood not aspirated, injection not painful, no injection resistance, negative IV test and no paresthesia  Additional Notes Reason for block:procedure for pain

## 2015-09-10 NOTE — Progress Notes (Signed)
   Marisa Daugherty is a 25 y.o. G2P1001 at [redacted]w[redacted]d  admitted for active labor  Subjective: Comfortable with epidural  Objective: Filed Vitals:   09/10/15 2110 09/10/15 2115 09/10/15 2120 09/10/15 2125  BP:      Pulse: 99 91 83 89  Temp:      TempSrc:      Resp:      Height:      Weight:          FHT:  FHR: 140 bpm, variability: moderate,  accelerations:  Present,  decelerations:  Absent UC:   regular, every 2-3 minutes SVE:  7/100/-2 Pad was very wet underneath pt, so presumed SROM.  AROM of forebag revealed mec stained fluid  Labs: Lab Results  Component Value Date   WBC 10.5 09/10/2015   HGB 11.6* 09/10/2015   HCT 34.5* 09/10/2015   MCV 88.5 09/10/2015   PLT 245 09/10/2015    Assessment / Plan: Spontaneous labor, progressing normally  Labor: Progressing normally Fetal Wellbeing:  Category I Pain Control:  Epidural Anticipated MOD:  NSVD  CRESENZO-DISHMAN,Henna Derderian 09/10/2015, 9:45 PM

## 2015-09-10 NOTE — H&P (Signed)
LABOR ADMISSION HISTORY AND PHYSICAL  Marisa Daugherty is a 25 y.o. female G2P1001 with IUP at [redacted]w[redacted]d by 7wk [place] presenting for SOL. She reports +FM, + contractions that started at 6AM, No LOF, no VB, no blurry vision, headaches or peripheral edema, and RUQ pain.  Prior delivery at term w/o any complications. She plans on breast feeding. She request IUD for birth control.  Dating: By 7wk [place] --->  Estimated Date of Delivery: 09/11/15  Sono:    , CWD, normal anatomy, cephalic presentation, 316g, 51% EFW   Prenatal History/Complications: Echogenic bowel on anatomy exam at [redacted]w[redacted]d, no repeat exam +GBS carrier Pruritic urticarial papules  Past Medical History: History reviewed. No pertinent past medical history.  Past Surgical History: Past Surgical History  Procedure Laterality Date  . Wisdom tooth extraction      Obstetrical History: OB History    Gravida Para Term Preterm AB TAB SAB Ectopic Multiple Living   Social History: Social History   Social History  . Marital Status: Single    Spouse Name: N/A  . Number of Children: N/A  . Years of Education: N/A   Social History Main Topics  . Smoking status: Never Smoker   . Smokeless tobacco: Never Used  . Alcohol Use: Yes     Comment: stopped with positive UPT   . Drug Use: No  . Sexual Activity: Yes    Birth Control/ Protection: None   Other Topics Concern  . None   Social History Narrative    Family History: Family History  Problem Relation Age of Onset  . Parkinsonism Maternal Grandmother     Allergies: No Known Allergies  Prescriptions prior to admission  Medication Sig Dispense Refill Last Dose  . diphenhydrAMINE (BENADRYL) 25 MG tablet Take 25 mg by mouth at bedtime as needed for itching.    09/09/2015 at Unknown time  . diphenhydrAMINE (BENADRYL) spray Apply 1 spray topically every 4 (four) hours as needed for itching.   09/09/2015 at Unknown time  . hydrocortisone cream 1 % Apply 1  application topically daily.   Past Week at Unknown time  . hydrOXYzine (VISTARIL) 25 MG capsule Take 1 capsule (25 mg total) by mouth 3 (three) times daily as needed. (Patient taking differently: Take 25 mg by mouth 3 (three) times daily as needed for itching. ) 30 capsule 0 Past Week at Unknown time  . Prenatal Vit-Fe Fumarate-FA (MULTIVITAMIN-PRENATAL) 27-0.8 MG TABS tablet Take 1 tablet by mouth daily at 12 noon.   09/10/2015 at Unknown time     Review of Systems   All systems reviewed and negative except as stated in HPI  BP 128/70 mmHg  Pulse 81  Temp(Src) 97.6 F (36.4 C)  Resp 18  LMP 11/29/2014 (Exact Date) General appearance: alert, cooperative and appears stated age Lungs: clear to auscultation bilaterally Heart: regular rate and rhythm Abdomen: soft, non-tender; bowel sounds normal Extremities:no sign of DVT, 1+ edema up to level of medial malleolus bilterally  Presentation: cephalic Fetal monitoringBaseline: 135 bpm, Variability: Good {> 6 bpm) and Accelerations: Reactive Uterine activityFrequency: Every 3-4 minutes Dilation: 5 Effacement (%): 90 Station: -1 Exam by:: Ginger Morris RN   Prenatal labs: ABO, Rh: A/POS/-- (06/28 1519) Antibody: NEG (06/28 1519) Rubella: Immune RPR: NON REAC (11/22 1035)  HBsAg: NEGATIVE (06/28 1519)  HIV: NONREACTIVE (11/22 1035)  GBS: Positive (01/10 0000)  1 hr Glucola 101 Genetic screening  NT normal Anatomy US: echogenic bowel, no repeat  Prenatal Transfer Tool  Maternal Diabetes: No Genetic Screening: Normal Maternal Ultrasounds/Referrals: Normal Fetal Ultrasounds or other Referrals:  Other:  echogenic bowel, no repeat Maternal Substance Abuse:  No Significant Maternal Medications:  None Significant Maternal Lab Results: Lab values include: Group B Strep positive  No results found for this or any previous visit (from the past 24 hour(s)).  Patient Active Problem List   Diagnosis Date Noted  . Pruritic urticarial  papules and plaques of pregnancy, antepartum 08/29/2015  . Group B Streptococcus carrier, +RV culture, currently pregnant 08/14/2015  . Supervision of normal pregnancy 01/27/2015    Assessment: Marisa Daugherty is a 25 y.o. G2P1001 at [redacted]w[redacted]d here for SOL.   #Labor: pt in active labor, expectant mgmt  #Pain: epidural #FWB: Cat 1 #ID: GBS pos, receiving amp  #MOF: breast #MOC: IUD  Wynne Dust, MD PGY 1 Family Medicine   I have seen and examined this patient and agree the above assessment.  MarisaAubery Daugherty 09/10/2015 10:23 PM

## 2015-09-10 NOTE — Anesthesia Preprocedure Evaluation (Signed)
Anesthesia Evaluation  Patient identified by MRN, date of birth, ID band Patient awake    Reviewed: Allergy & Precautions, H&P , NPO status , Patient's Chart, lab work & pertinent test results  Airway Mallampati: I  TM Distance: >3 FB Neck ROM: full    Dental no notable dental hx.    Pulmonary neg pulmonary ROS,    Pulmonary exam normal breath sounds clear to auscultation       Cardiovascular negative cardio ROS Normal cardiovascular exam     Neuro/Psych negative neurological ROS  negative psych ROS   GI/Hepatic negative GI ROS, Neg liver ROS,   Endo/Other  negative endocrine ROS  Renal/GU negative Renal ROS     Musculoskeletal   Abdominal Normal abdominal exam  (+)   Peds  Hematology negative hematology ROS (+)   Anesthesia Other Findings   Reproductive/Obstetrics (+) Pregnancy                             Anesthesia Physical Anesthesia Plan  ASA: II  Anesthesia Plan: Epidural   Post-op Pain Management:    Induction:   Airway Management Planned:   Additional Equipment:   Intra-op Plan:   Post-operative Plan:   Informed Consent: I have reviewed the patients History and Physical, chart, labs and discussed the procedure including the risks, benefits and alternatives for the proposed anesthesia with the patient or authorized representative who has indicated his/her understanding and acceptance.     Plan Discussed with:   Anesthesia Plan Comments:         Anesthesia Quick Evaluation  

## 2015-09-10 NOTE — MAU Note (Signed)
Pt presents to MAU with complaints of contractions throughout the day becoming more regular this evening. Denies any vaginal bleeding or LOF

## 2015-09-11 ENCOUNTER — Encounter (HOSPITAL_COMMUNITY): Payer: Self-pay

## 2015-09-11 DIAGNOSIS — Z3A39 39 weeks gestation of pregnancy: Secondary | ICD-10-CM

## 2015-09-11 DIAGNOSIS — O99824 Streptococcus B carrier state complicating childbirth: Secondary | ICD-10-CM

## 2015-09-11 LAB — ABO/RH: ABO/RH(D): A POS

## 2015-09-11 LAB — RPR: RPR Ser Ql: NONREACTIVE

## 2015-09-11 MED ORDER — BENZOCAINE-MENTHOL 20-0.5 % EX AERO: 1.0000 "application " | INHALATION_SPRAY | CUTANEOUS | Status: DC | PRN

## 2015-09-11 MED ORDER — FLEET ENEMA 7-19 GM/118ML RE ENEM: 1.0000 | ENEMA | Freq: Every day | RECTAL | Status: DC | PRN

## 2015-09-11 MED ORDER — SENNOSIDES-DOCUSATE SODIUM 8.6-50 MG PO TABS
2.0000 | ORAL_TABLET | ORAL | Status: DC
  Administered 2015-09-11: 2 via ORAL
  Filled 2015-09-11: qty 2

## 2015-09-11 MED ORDER — MEASLES, MUMPS & RUBELLA VAC ~~LOC~~ INJ
0.5000 mL | INJECTION | Freq: Once | SUBCUTANEOUS | Status: DC
  Filled 2015-09-11: qty 0.5

## 2015-09-11 MED ORDER — DIBUCAINE 1 % RE OINT: 1.0000 "application " | TOPICAL_OINTMENT | RECTAL | Status: DC | PRN

## 2015-09-11 MED ORDER — TETANUS-DIPHTH-ACELL PERTUSSIS 5-2.5-18.5 LF-MCG/0.5 IM SUSP: 0.5000 mL | Freq: Once | INTRAMUSCULAR | Status: DC

## 2015-09-11 MED ORDER — FERROUS SULFATE 325 (65 FE) MG PO TABS
325.0000 mg | ORAL_TABLET | Freq: Two times a day (BID) | ORAL | Status: DC
  Administered 2015-09-11 – 2015-09-12 (×3): 325 mg via ORAL
  Filled 2015-09-11 (×3): qty 1

## 2015-09-11 MED ORDER — ONDANSETRON HCL 4 MG/2ML IJ SOLN: 4.0000 mg | INTRAMUSCULAR | Status: DC | PRN

## 2015-09-11 MED ORDER — IBUPROFEN 600 MG PO TABS
600.0000 mg | ORAL_TABLET | Freq: Four times a day (QID) | ORAL | Status: DC
  Administered 2015-09-11 – 2015-09-12 (×4): 600 mg via ORAL
  Filled 2015-09-11 (×5): qty 1

## 2015-09-11 MED ORDER — BISACODYL 10 MG RE SUPP
10.0000 mg | Freq: Every day | RECTAL | Status: DC | PRN
Start: 2015-09-11 — End: 2015-09-12

## 2015-09-11 MED ORDER — ACETAMINOPHEN 325 MG PO TABS: 650.0000 mg | ORAL_TABLET | ORAL | Status: DC | PRN

## 2015-09-11 MED ORDER — ZOLPIDEM TARTRATE 5 MG PO TABS: 5.0000 mg | ORAL_TABLET | Freq: Every evening | ORAL | Status: DC | PRN

## 2015-09-11 MED ORDER — METHYLERGONOVINE MALEATE 0.2 MG PO TABS: 0.2000 mg | ORAL_TABLET | ORAL | Status: DC | PRN

## 2015-09-11 MED ORDER — DIPHENHYDRAMINE HCL 25 MG PO CAPS: 25.0000 mg | ORAL_CAPSULE | Freq: Four times a day (QID) | ORAL | Status: DC | PRN

## 2015-09-11 MED ORDER — WITCH HAZEL-GLYCERIN EX PADS: 1.0000 "application " | MEDICATED_PAD | CUTANEOUS | Status: DC | PRN

## 2015-09-11 MED ORDER — SIMETHICONE 80 MG PO CHEW: 80.0000 mg | CHEWABLE_TABLET | ORAL | Status: DC | PRN

## 2015-09-11 MED ORDER — LANOLIN HYDROUS EX OINT: TOPICAL_OINTMENT | CUTANEOUS | Status: DC | PRN

## 2015-09-11 MED ORDER — IBUPROFEN 600 MG PO TABS: 600.0000 mg | ORAL_TABLET | Freq: Four times a day (QID) | ORAL | Status: DC

## 2015-09-11 MED ORDER — METHYLERGONOVINE MALEATE 0.2 MG/ML IJ SOLN: 0.2000 mg | INTRAMUSCULAR | Status: DC | PRN

## 2015-09-11 MED ORDER — PRENATAL MULTIVITAMIN CH
1.0000 | ORAL_TABLET | Freq: Every day | ORAL | Status: DC
  Administered 2015-09-11 – 2015-09-12 (×2): 1 via ORAL
  Filled 2015-09-11 (×2): qty 1

## 2015-09-11 MED ORDER — ONDANSETRON HCL 4 MG PO TABS: 4.0000 mg | ORAL_TABLET | ORAL | Status: DC | PRN

## 2015-09-11 MED ORDER — ACETAMINOPHEN 325 MG PO TABS: 650.0000 mg | ORAL_TABLET | ORAL | Status: AC | PRN

## 2015-09-11 NOTE — Anesthesia Postprocedure Evaluation (Signed)
Anesthesia Post Note  Patient: Marisa Daugherty  Procedure(s) Performed: * No procedures listed *  Patient location during evaluation: Mother Baby Anesthesia Type: Epidural Level of consciousness: awake Pain management: satisfactory to patient Vital Signs Assessment: post-procedure vital signs reviewed and stable Respiratory status: spontaneous breathing Cardiovascular status: stable Anesthetic complications: no    Last Vitals:  Filed Vitals:   09/11/15 0800 09/11/15 1153  BP: 123/57 112/65  Pulse: 80 84  Temp: 36.6 C 36.6 C  Resp: 18 18    Last Pain:  Filed Vitals:   09/11/15 1154  PainSc: 3                  Dhara Schepp

## 2015-09-11 NOTE — Discharge Instructions (Signed)

## 2015-09-11 NOTE — Progress Notes (Signed)
   Marisa Daugherty is a 25 y.o. G2P1001 at [redacted]w[redacted]d  admitted for active labor  Subjective: Comfortable with epidural, sleeping lying down.   Objective: Filed Vitals:   09/11/15 0001 09/11/15 0031 09/11/15 0101 09/11/15 0131  BP: 121/48 114/62 110/62 128/67  Pulse: 68 74 80 88  Temp:      TempSrc:      Resp:   16   Height:      Weight:          FHT:  FHR: 140 bpm, variability: moderate,  accelerations:  Present,  decelerations:  Absent SVE:  10/100/+3   Labs: Lab Results  Component Value Date   WBC 10.5 09/10/2015   HGB 11.6* 09/10/2015   HCT 34.5* 09/10/2015   MCV 88.5 09/10/2015   PLT 245 09/10/2015    Assessment / Plan: Spontaneous labor, progressing normally  Labor: Progressing normally, continue expectant mgmt -labor down until pt feels pressure/urge to push Fetal Wellbeing:  Category I Pain Control:  Epidural Anticipated MOD:  NSVD  Dartagnan Beavers 09/11/2015, 1:55 AM

## 2015-09-11 NOTE — Lactation Note (Signed)
This note was copied from a baby's chart. Lactation Consultation Note Initial visit at 13 hours of age.  Mom reports just finishing a 10 minute feeding and feeling better.  Baby showing feeding cues.  Assisted with hand expression technique with several drops of colostrum noted.  Assisted with latch on left breast in football hold and baby fussy.  Diaper changed with void and stool.  Back to breast with hand pump used to help evert nipple.  Baby has intermittent sucking, but good swallows are heard.  Baby came off of breast with slightly compressed tip of nipple.  Re-latched and baby continued feeding.  Mom feels encouraged and denies pain with latch.  Mom to call for assist with [facility] RN for help with positioning and pillow support as needed.  [facility] LC resources given and discussed.  Encouraged to feed with early cues on demand.  Early newborn behavior discussed.   Mom to call for assist as needed.        Patient Name: Girl Yamil Dougher [id number] Date: 09/11/2015 Reason for consult: Initial assessment   Maternal Data Has patient been taught Hand Expression?: Yes Does the patient have breastfeeding experience prior to this delivery?: Yes  Feeding Feeding Type: Breast Fed Length of feed:  (observed 10 minutes)  LATCH Score/Interventions Latch: Repeated attempts needed to sustain latch, nipple held in mouth throughout feeding, stimulation needed to elicit sucking reflex. Intervention(s): Skin to skin;Teach feeding cues;Waking techniques  Audible Swallowing: Spontaneous and intermittent Intervention(s): Skin to skin;Hand expression  Type of Nipple: Flat Intervention(s): Hand pump  Comfort (Breast/Nipple): Soft / non-tender     Hold (Positioning): Assistance needed to correctly position infant at breast and maintain latch. Intervention(s): Breastfeeding basics reviewed;Support Pillows;Position options;Skin to skin  LATCH Score: 7  Lactation Tools Discussed/Used WIC Program: No   Consult  Status Consult Status: Follow-up Date: 09/12/15 Follow-up type: In-patient    Shoptaw, Arvella Merles 09/11/2015, 6:00 PM

## 2015-09-12 NOTE — Lactation Note (Signed)
This note was copied from a baby's chart. Lactation Consultation Note  Spoon fed 2 ml of colostrum and cup fed 5 ml of formula. Marisa Daugherty;s technique improved as the feeding progressed.  Parents agreed to stay and try an SNS at the next feeding.   Patient Name: Marisa Daugherty Marisa Daugherty Date: 09/12/2015 Reason for consult: Follow-up assessment   Maternal Data    Feeding Feeding Type: Breast Fed  LATCH Score/Interventions Latch: Repeated attempts needed to sustain latch, nipple held in mouth throughout feeding, stimulation needed to elicit sucking reflex.  Audible Swallowing: A few with stimulation  Type of Nipple: Flat  Comfort (Breast/Nipple): Soft / non-tender     Hold (Positioning): Assistance needed to correctly position infant at breast and maintain latch.  LATCH Score: 6  Lactation Tools Discussed/Used Tools: Nipple Dorris Carnes   Consult Status      Marisa Daugherty 09/12/2015, 9:43 AM

## 2015-09-12 NOTE — Lactation Note (Addendum)
This note was copied from a baby's chart. Lactation Consultation Note   Baby is fussy so mom was agreeable to BF her.  She applied the NS. [name] sucks intermittently but does not display a good pattern of suckling bursts.  She is becoming increasing agitated. Double electric breast pump initiated and mom expressed 1 ml. It was administered along side the NS and while it was flowing she suckled. As soon as the flow stopped she suckled. An anterior bubble palate was noted and the posterior portion of her mouth was tight.  When a gloved finger was deep in her mouth she suckled more consistently.  Plan is to collect colostrum to feed to [name] via cup. If the colostrum does not satisfy her formula will be initiated.  Patient Name: Marisa Daugherty [id number] Date: 09/12/2015 Reason for consult: Follow-up assessment   Maternal Data    Feeding Feeding Type: Breast Fed  LATCH Score/Interventions Latch: Repeated attempts needed to sustain latch, nipple held in mouth throughout feeding, stimulation needed to elicit sucking reflex.  Audible Swallowing: A few with stimulation  Type of Nipple: Flat  Comfort (Breast/Nipple): Soft / non-tender     Hold (Positioning): Assistance needed to correctly position infant at breast and maintain latch.  LATCH Score: 6  Lactation Tools Discussed/Used Tools: Nipple Dorris Carnes   Consult Status      Soyla Dryer 09/12/2015, 9:22 AM

## 2015-09-12 NOTE — Lactation Note (Signed)
This note was copied from a baby's chart. Lactation Consultation Note  MOB latched baby independently. Demonstrated how to set up SNS. Mother is eager to be independent.  She reported that Marisa Daugherty transferred some of the formula independently but most of it was pushed.  Plan set up with discharge including BF, formula amounts and pumping. RN to review formula prep with parents. Resources given to parents regarding "tongue-tie" and an outpatient visit has been scheduled. Patient Name: Marisa Daugherty [id number] Date: 09/12/2015 Reason for consult: Follow-up assessment   Maternal Data    Feeding Feeding Type: Breast Milk with Formula added  LATCH Score/Interventions Latch: Repeated attempts needed to sustain latch, nipple held in mouth throughout feeding, stimulation needed to elicit sucking reflex.  Audible Swallowing: A few with stimulation  Type of Nipple: Flat (short shaft)  Comfort (Breast/Nipple): Soft / non-tender     Hold (Positioning): No assistance needed to correctly position infant at breast.  LATCH Score: 7  Lactation Tools Discussed/Used Tools: Nipple Shields Nipple shield size: 20   Consult Status Consult Status: Follow-up Date: 09/17/15 Follow-up type: Out-patient    Soyla Dryer 09/12/2015, 12:11 PM

## 2015-09-12 NOTE — Discharge Summary (Signed)
OB Discharge Summary     Patient Name: Marisa Daugherty DOB: [birth year] MRN: [id number]  Date of admission: 09/10/2015 Delivering MD: Demetrios Loll T   Date of discharge: 09/12/2015  Admitting diagnosis: 39w ctx Intrauterine pregnancy: [redacted]w[redacted]d     Secondary diagnosis:  Active Problems:   Active labor at term  Additional problems:  Echogenic bowel on anatomy exam at [redacted]w[redacted]d, no repeat exam +GBS carrier Pruritic urticarial papules     Discharge diagnosis: Term Pregnancy Delivered                                                                                                Post partum procedures:none  Augmentation: none  Complications: None  Hospital course:  Onset of Labor With Vaginal Delivery     25 y.o. yo [id number] at [redacted]w[redacted]d was admitted in Active Labor on 09/10/2015. Patient had an uncomplicated labor course as follows:  Membrane Rupture Time/Date: 9:40 PM ,09/10/2015   Intrapartum Procedures: Episiotomy: None [1]                                         Lacerations:  None [1]  Patient had a delivery of a Viable infant. 09/11/2015  Information for the patient's newborn:  Indi, [place] [[id number]]  Delivery Method: Vaginal, Spontaneous Delivery (Filed from Delivery Summary)    Pateint had an uncomplicated postpartum course.  She is ambulating, tolerating a regular diet, passing flatus, and urinating well. Patient is discharged home in stable condition on 09/12/2015.    Physical exam  Filed Vitals:   09/11/15 0800 09/11/15 1153 09/11/15 2000 09/12/15 0545  BP: 123/57 112/65 116/60 97/50  Pulse: 80 84 80 69  Temp: 97.8 F (36.6 C) 97.8 F (36.6 C) 98 F (36.7 C) 97.8 F (36.6 C)  TempSrc: Oral Oral Oral Oral  Resp: Height:      Weight:      SpO2: 100% 98%     General: alert, cooperative and no distress Lochia: appropriate Uterine Fundus: firm Incision: N/A DVT Evaluation: No evidence of DVT seen on physical exam. No significant calf/ankle  edema. Labs: Lab Results  Component Value Date   WBC 10.5 09/10/2015   HGB 11.6* 09/10/2015   HCT 34.5* 09/10/2015   MCV 88.5 09/10/2015   PLT 245 09/10/2015   CMP Latest Ref Rng 08/27/2015  Glucose 65 - 99 mg/dL 87  BUN 7 - 25 mg/dL 8  Creatinine 6.96 - 2.95 mg/dL 2.84  Sodium 132 - 440 mmol/L 133(L)  Potassium 3.5 - 5.3 mmol/L 3.7  Chloride 98 - 110 mmol/L 102  CO2 20 - 31 mmol/L 21  Calcium 8.6 - 10.2 mg/dL 1.0(U)  Total Protein 6.1 - 8.1 g/dL 6.0(L)  Total Bilirubin 0.2 - 1.2 mg/dL 0.3  Alkaline Phos 33 - 115 U/L 112  AST 10 - 30 U/L 15  ALT 6 - 29 U/L 8    Discharge instruction: per After Visit Summary and "Baby and Me Booklet".  After visit meds:    Medication List    TAKE these medications        acetaminophen 325 MG tablet  Commonly known as:  TYLENOL  Take 2 tablets (650 mg total) by mouth every 4 (four) hours as needed (for pain scale < 4).     diphenhydrAMINE 25 MG tablet  Commonly known as:  BENADRYL  Take 25 mg by mouth at bedtime as needed for itching.     diphenhydrAMINE spray  Commonly known as:  BENADRYL  Apply 1 spray topically every 4 (four) hours as needed for itching.     hydrocortisone cream 1 %  Apply 1 application topically daily.     hydrOXYzine 25 MG capsule  Commonly known as:  VISTARIL  Take 1 capsule (25 mg total) by mouth 3 (three) times daily as needed.     ibuprofen 600 MG tablet  Commonly known as:  ADVIL,MOTRIN  Take 1 tablet (600 mg total) by mouth every 6 (six) hours.     multivitamin-prenatal 27-0.8 MG Tabs tablet  Take 1 tablet by mouth daily at 12 noon.        Diet: routine diet  Activity: Advance as tolerated. Pelvic rest for 6 weeks.   Outpatient follow up:6 weeks Follow up Appt:Future Appointments Date Time Provider Department Center  09/14/2015 9:45 AM [facility] NURSE [facility]   Follow up Visit:No Follow-up on file. Follow-up Information    Follow up with Center for [facility]. Schedule an appointment as soon as possible for a visit in 6 weeks.   Specialty:  Obstetrics and Gynecology   Why:  post partum check   Contact information:   [address] [place] [address] [phone]      Postpartum contraception: IUD Mirena  Newborn Data: Live born female  Birth Weight: 7 lb 10.6 oz (3475 g) APGAR: 8, 9  Baby Feeding: Breast Disposition:home with mother   09/12/2015 Wynne Dust, MD   CNM attestation I have seen and examined this patient and agree with above documentation in the resident's note.   LOUCILLE TAKACH is a 25 y.o. [phone] s/p SVD.   Pain is well controlled.  Plan for birth control is IUD.  Method of Feeding: breast  PE:  BP 97/50 mmHg  Pulse 69  Temp(Src) 97.8 F (36.6 C) (Oral)  Resp 16  Ht  (1.575 m)  Wt 76.204 kg (168 lb)  BMI 30.72 kg/m2  SpO2 98%  LMP 11/29/2014 (Exact Date)  Breastfeeding? Unknown Fundus firm   Recent Labs  09/10/15 1920  HGB 11.6*  HCT 34.5*     Plan: discharge today - postpartum care discussed - f/u clinic in 6 weeks for postpartum visit   Cam Hai, CNM 9:12 AM  09/12/2015

## 2015-09-14 ENCOUNTER — Other Ambulatory Visit: Payer: Managed Care, Other (non HMO)

## 2015-09-17 ENCOUNTER — Ambulatory Visit (HOSPITAL_COMMUNITY): Payer: 59

## 2015-10-23 ENCOUNTER — Ambulatory Visit (INDEPENDENT_AMBULATORY_CARE_PROVIDER_SITE_OTHER): Payer: Managed Care, Other (non HMO) | Admitting: Obstetrics & Gynecology

## 2015-10-23 ENCOUNTER — Encounter: Payer: Self-pay | Admitting: *Deleted

## 2015-10-23 ENCOUNTER — Ambulatory Visit: Payer: Managed Care, Other (non HMO) | Admitting: Obstetrics & Gynecology

## 2015-10-23 DIAGNOSIS — Z30430 Encounter for insertion of intrauterine contraceptive device: Secondary | ICD-10-CM

## 2015-10-23 MED ORDER — LEVONORGESTREL 18.6 MCG/DAY IU IUD
INTRAUTERINE_SYSTEM | Freq: Once | INTRAUTERINE | Status: AC
  Administered 2015-10-23: 12:00:00 via INTRAUTERINE

## 2015-10-23 MED ORDER — LEVONORGESTREL 18.6 MCG/DAY IU IUD: INTRAUTERINE_SYSTEM | Freq: Once | INTRAUTERINE | Status: DC

## 2015-10-23 NOTE — Patient Instructions (Signed)

## 2015-10-23 NOTE — Progress Notes (Signed)
Pt here today for postpartum check, currently breastfeeding via pumping.  Has no complaints at this time, would like the IUD for contraception, has not been sexually active.

## 2015-10-23 NOTE — Progress Notes (Signed)
  Subjective:     Marisa Daugherty is a 25 y.o. [phone] female who presents for a postpaArie Sabinartum visit. She is 6 weeks postpartum following a spontaneous vaginal delivery. I have fully reviewed the prenatal and intrapartum course. The delivery was at 40 gestational weeks.  Anesthesia: epidural. Postpartum course has been uncomplicated. Baby's course has been uncomplicated. Baby is feeding by breast. Bleeding no bleeding. Bowel function is normal. Bladder function is normal. Patient is not sexually active. Desired contraception method is progestin IUD. Postpartum depression screening: negative.  The following portions of the patient's history were reviewed and updated as appropriate: allergies, current medications, past family history, past medical history, past social history, past surgical history and problem list.  Normal pap on 01/27/2015.   Review of Systems Pertinent items noted in HPI and remainder of comprehensive ROS otherwise negative.   Objective:    BP 114/76 mmHg  Pulse 78  Resp 18  Wt 131 lb (59.421 kg)  Breastfeeding? Yes  General:  alert and no distress   Breasts:  inspection negative, no nipple discharge or bleeding, no masses or nodularity palpable  Lungs: clear to auscultation bilaterally  Heart:  regular rate and rhythm  Abdomen: soft, non-tender; bowel sounds normal; no masses,  no organomegaly   Vulva:  normal  Vagina: normal vagina, no discharge, exudate, lesion, or erythema  Cervix:  multiparous appearance, no cervical motion tenderness and no lesions  Corpus: normal size, contour, position, consistency, mobility, non-tender  Adnexa:  normal adnexa and no mass, fullness, tenderness  Rectal Exam: Not performed.      Liletta IUD Insertion Procedure Note Patient identified, informed consent performed, consent signed.   Discussed risks of irregular bleeding, cramping, infection, malpositioning or misplacement of the IUD outside the uterus which may require further procedure  such as laparoscopy. Time out was performed.  Urine pregnancy test negative.  Speculum placed in the vagina.  Cervix visualized.  Cleaned with Betadine x 2.  Grasped anteriorly with a single tooth tenaculum.  Uterus sounded to 8 cm.  Liletta IUD placed per manufacturer's recommendations.  Strings trimmed to 3 cm. Tenaculum was removed, good hemostasis noted.  Patient tolerated procedure well.   Patient was given post-procedure instructions.  She was advised to have backup contraception for one week.  Patient was also asked to check IUD strings periodically and follow up in 4 weeks for IUD check.    Assessment:   Normal postpartum exam. Liletta IUD placed.    Plan:   1. Contraception: IUD placed today 2. Follow up in: 4 weeks for IUD check or as needed.    Jaynie CollinsUGONNA  Marisa Cristo, MD, FACOG Attending Obstetrician & Gynecologist, [facility]

## 2015-10-26 ENCOUNTER — Encounter: Payer: Self-pay | Admitting: *Deleted

## 2015-11-20 ENCOUNTER — Ambulatory Visit: Payer: Managed Care, Other (non HMO) | Admitting: Obstetrics & Gynecology

## 2015-11-20 DIAGNOSIS — Z30431 Encounter for routine checking of intrauterine contraceptive device: Secondary | ICD-10-CM

## 2017-03-10 IMAGING — US US OB NUCHAL TRANSLUCENCY 1ST GEST
1 series · 13 of 28 positions shown · non-contrast
Comparison: none

[Series 1: us ob nuchal translucency 1st gest · 0.23mm/px · 13 of 30 slices shown]
[im 2/30]
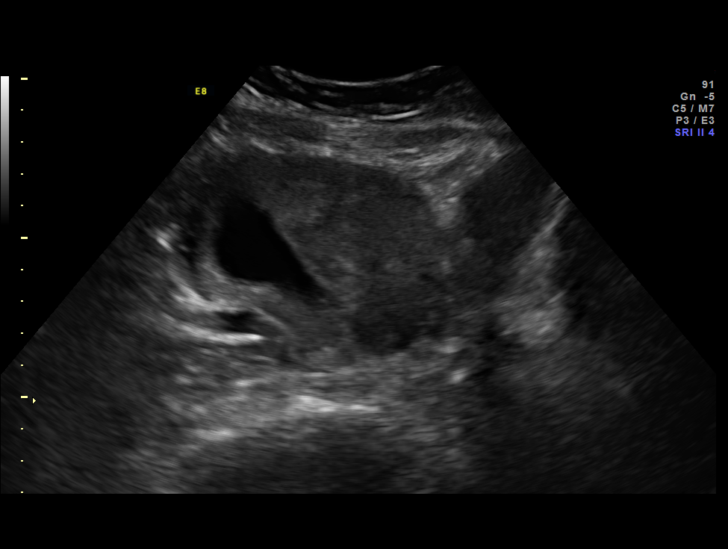
[im 4/30]
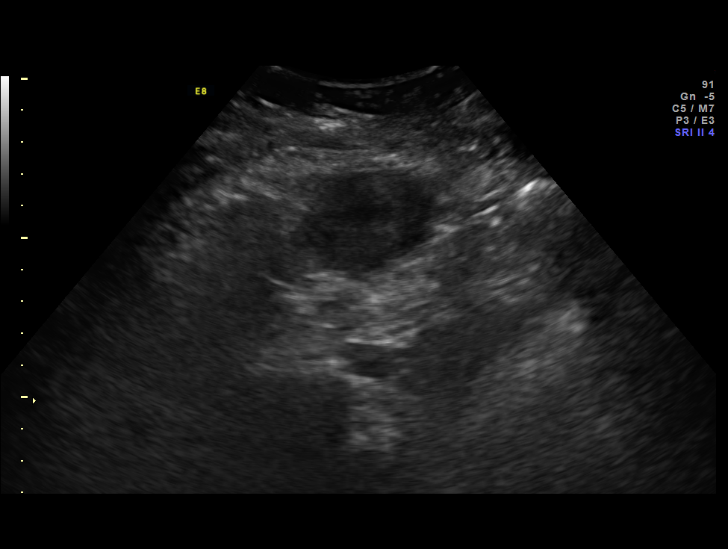
[im 6/30]
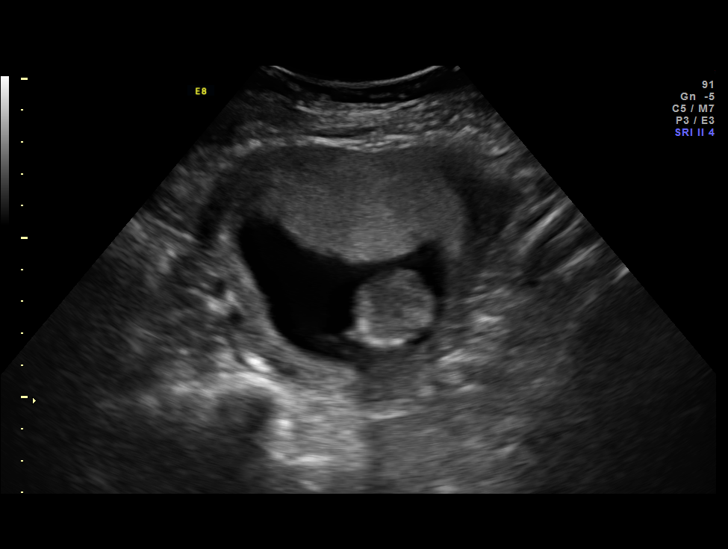
[im 8/30]
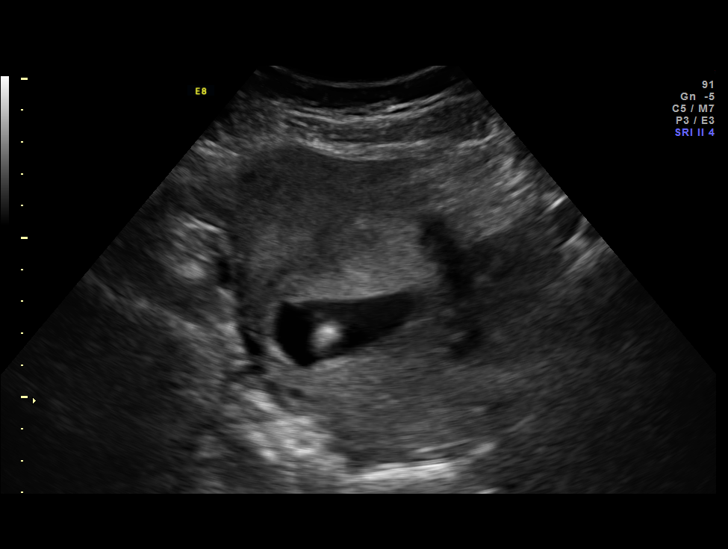
[im 10/30]
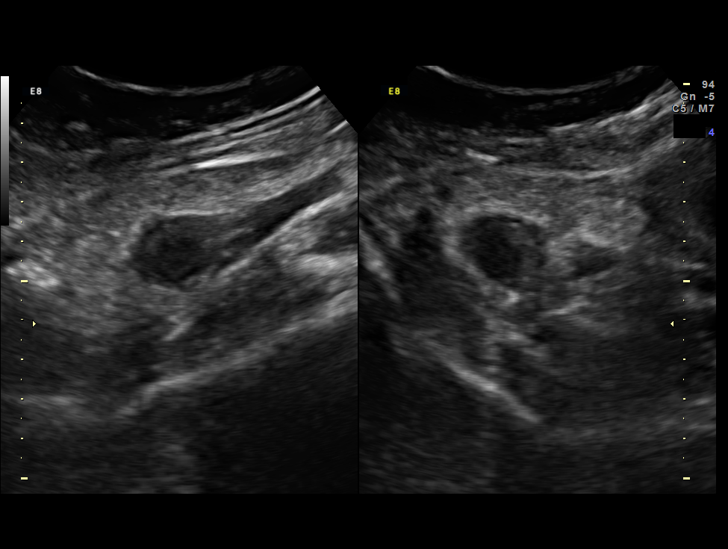
[im 12/30]
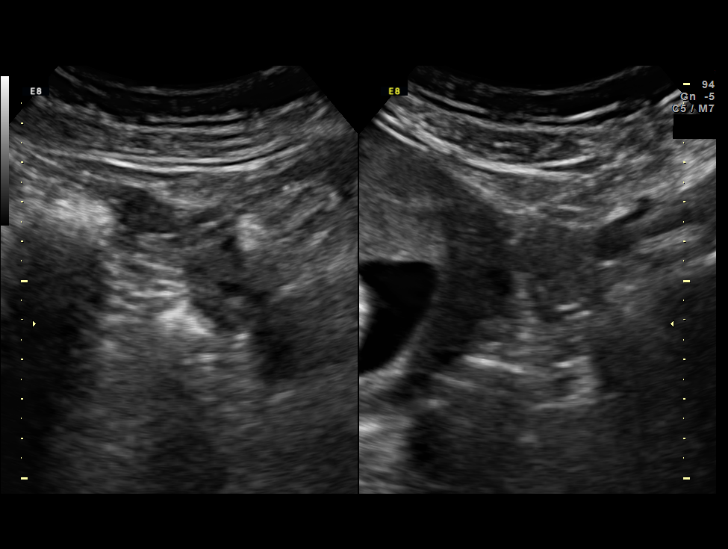
[im 16/30]
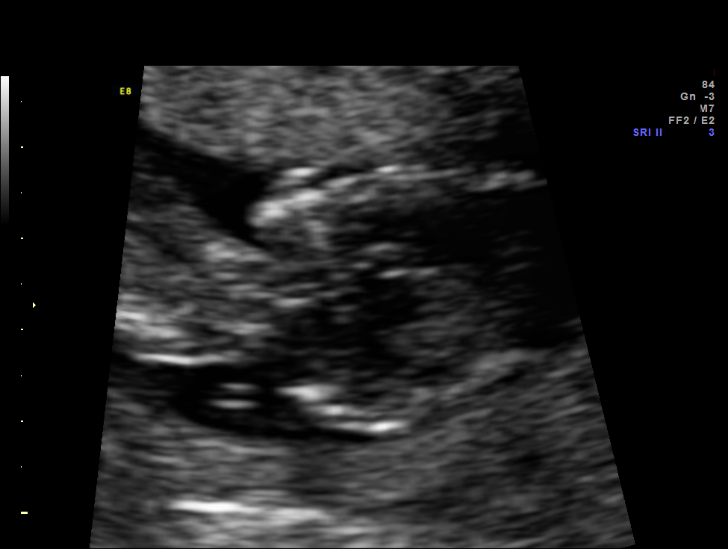
[im 18/30]
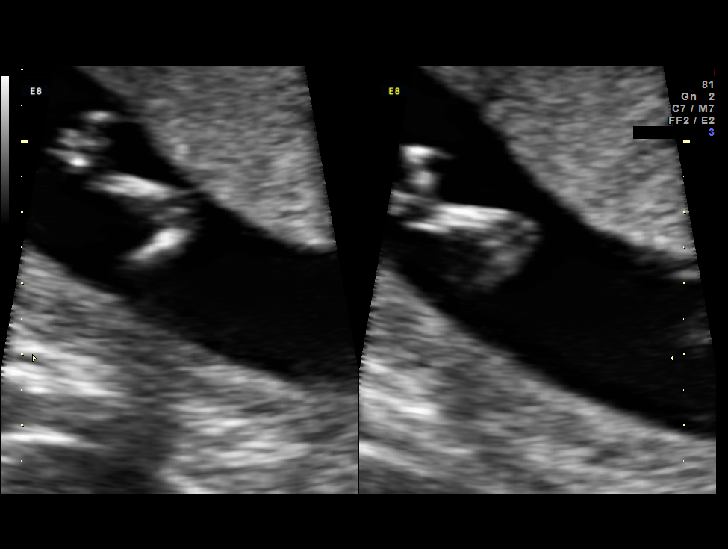
[im 20/30]
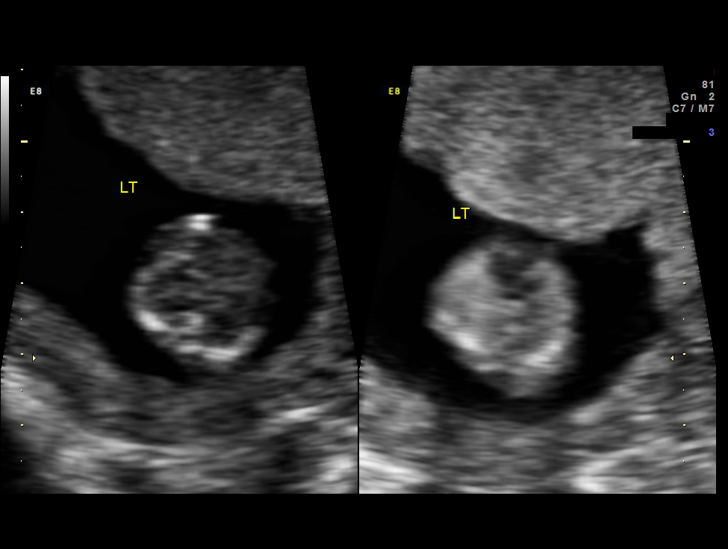
[im 22/30]
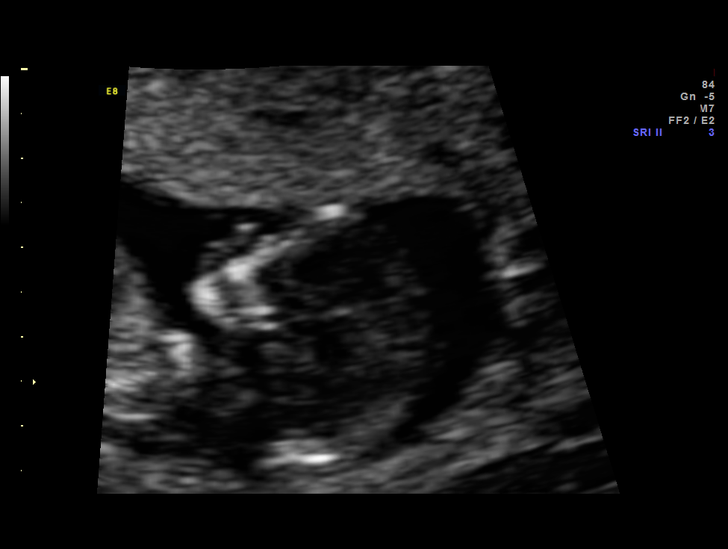
[im 24/30]
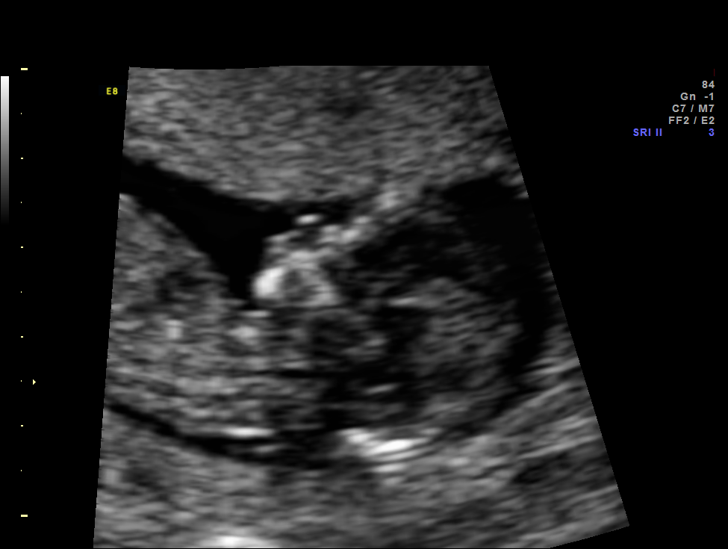
[im 26/30]
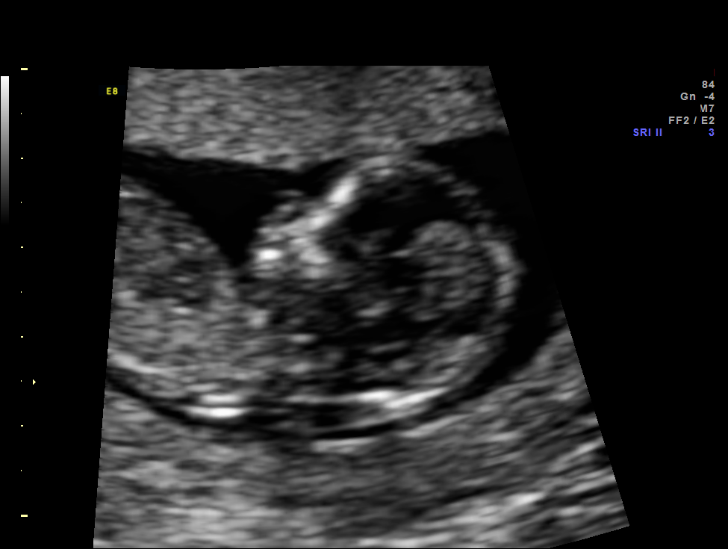
[im 28/30]
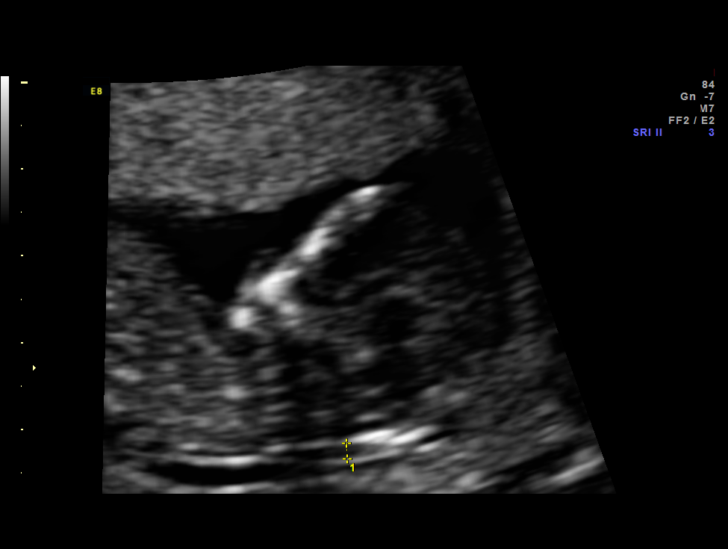

[13 of 28 positions shown; findings below may reference images not displayed]

OBSTETRICS REPORT
(Signed Final 03/04/2015 [DATE])

Date:

Service(s) Provided

US FETAL NUCHAL TRANSLUCENCY                           76813.0
MEASUREMENT
Indications

12 weeks gestation of pregnancy
First trimester aneuploidy screen (NT)                 Z36
Fetal Evaluation

Num Of             1
Fetuses:
Preg. Location:    Intrauterine
Gest. Sac:         Intrauterine
Fetal Pole:        Visualized
Fetal Heart        152                          bpm
Rate:
Cardiac Activity:  Observed
Biometry

CRL:     69.7   m    G. Age:   13w 0d                  EDD:   09/09/15
m

NT:        1.8  m
m
Gestational Age

LMP:           13w 4d        Date:  11/29/14                  EDD:   09/05/15
Best:          12w 5d    Det. By:   Early Ultrasound          EDD:   09/11/15
(01/27/15)
1st Trimester Genetic Sonogram Screening

CRL:            69.7  mm     G. Age:   13w 0d                 EDD:   09/09/15
Nuc Trans:       1.8  mm

Nasal Bone:                  Present
Cervix Uterus Adnexa

Cervix:       Closed.
Uterus:       No abnormality visualized.
Cul De Sac:   No free fluid seen.

Left Ovary:    Within normal limits.
Right Ovary:   Within normal limits.

Adnexa:     No abnormality visualized.
Impression

Single IUP at 12w 5d
Normal NT (1.8 mm).  Nasal bone was visualized.
First trimester aneuploidy screen performed as noted
above.  Please do not draw triple/quad screen, though
patient should be offered MSAFP for neural tube defect
screening.
Recommendations

Recommend ultrasound for fetal anatomy at 18-20 weeks
gestation
